# Patient Record
Sex: Male | Born: 1980 | Race: White | Hispanic: No | Marital: Married | Smoking: Never smoker
Health system: Southern US, Community
[De-identification: ages and names within clinical notes are randomized; demographics above are authoritative.]

## PROBLEM LIST (undated history)

## (undated) DIAGNOSIS — J309 Allergic rhinitis, unspecified: Secondary | ICD-10-CM

## (undated) HISTORY — DX: Allergic rhinitis, unspecified: J30.9

---

## 2001-01-31 ENCOUNTER — Emergency Department (HOSPITAL_COMMUNITY): Admission: EM | Admit: 2001-01-31 | Discharge: 2001-01-31 | Payer: Self-pay | Admitting: Emergency Medicine

## 2001-07-14 ENCOUNTER — Encounter: Payer: Self-pay | Admitting: Occupational Medicine

## 2001-07-14 ENCOUNTER — Encounter: Admission: RE | Admit: 2001-07-14 | Discharge: 2001-07-14 | Payer: Self-pay | Admitting: Occupational Medicine

## 2004-11-20 ENCOUNTER — Emergency Department (HOSPITAL_COMMUNITY): Admission: EM | Admit: 2004-11-20 | Discharge: 2004-11-20 | Payer: Self-pay | Admitting: Family Medicine

## 2004-12-10 ENCOUNTER — Encounter: Admission: RE | Admit: 2004-12-10 | Discharge: 2004-12-10 | Payer: Self-pay | Admitting: Gastroenterology

## 2004-12-17 ENCOUNTER — Encounter: Admission: RE | Admit: 2004-12-17 | Discharge: 2004-12-17 | Payer: Self-pay | Admitting: Gastroenterology

## 2005-02-19 ENCOUNTER — Ambulatory Visit (HOSPITAL_COMMUNITY): Admission: RE | Admit: 2005-02-19 | Discharge: 2005-02-19 | Payer: Self-pay | Admitting: *Deleted

## 2005-06-30 ENCOUNTER — Encounter: Admission: RE | Admit: 2005-06-30 | Discharge: 2005-06-30 | Payer: Self-pay | Admitting: Gastroenterology

## 2006-07-23 IMAGING — CT CT PELVIS W/ CM
1 of 3 series · 14 of 32 positions shown, 19 images · IV contrast (READICAT/WATER & 100 ML OMNI 300)
Comparison: none

CLINICAL DATA: Right upper quadrant pain for two weeks with nausea and diarrhea. 
 ABDOMEN CT WITH CONTRAST:
TECHNIQUE: Multidetector CT imaging of the abdomen was performed following the standard protocol during bolus administration of intravenous contrast.
 Contrast:    100 cc Omnipaque 300
TECHNIQUE: Multidetector CT imaging of the pelvis was performed following the standard protocol during bolus administration of intravenous contrast.

[Series 2: routine abdomen · axial · 0.70mm/px · z∈[-389,+1]mm · 14 of 88 slices shown, 19 images]
[im 5/88  soft-tissue]
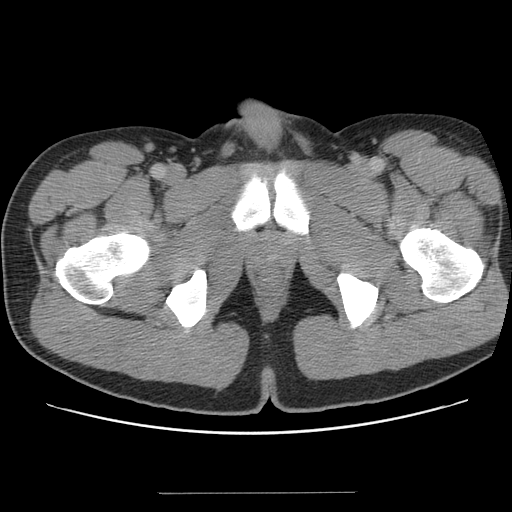
[im 5/88  bone]
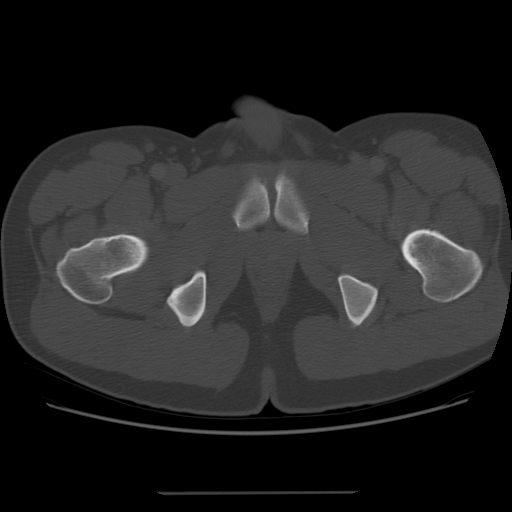
[im 13/88  soft-tissue]
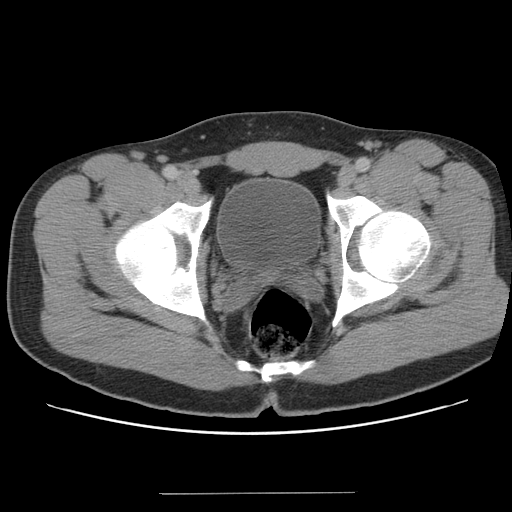
[im 17/88  soft-tissue]
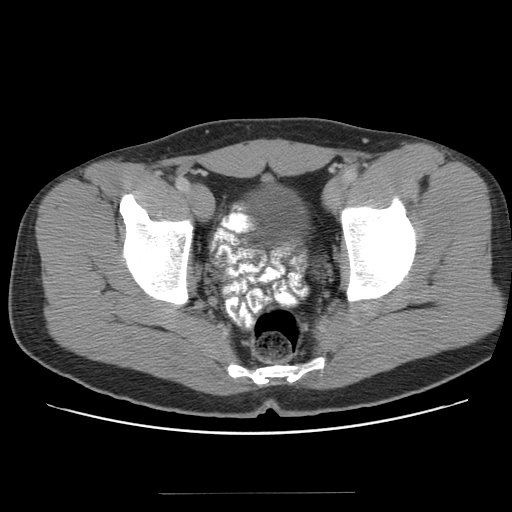
[im 25/88  soft-tissue]
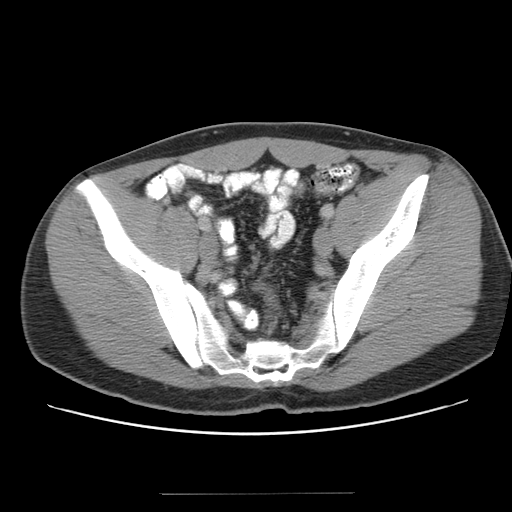
[im 30/88  soft-tissue]
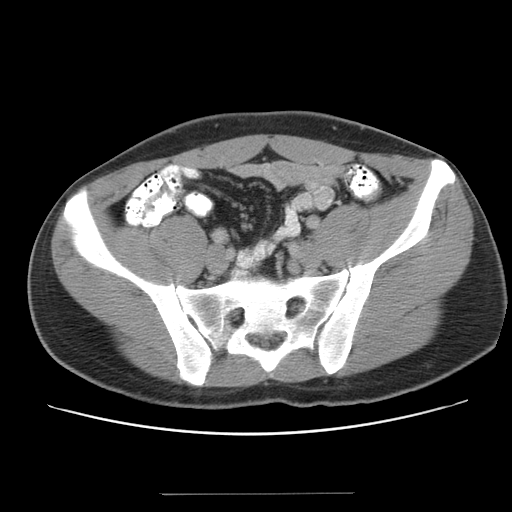
[im 38/88  soft-tissue]
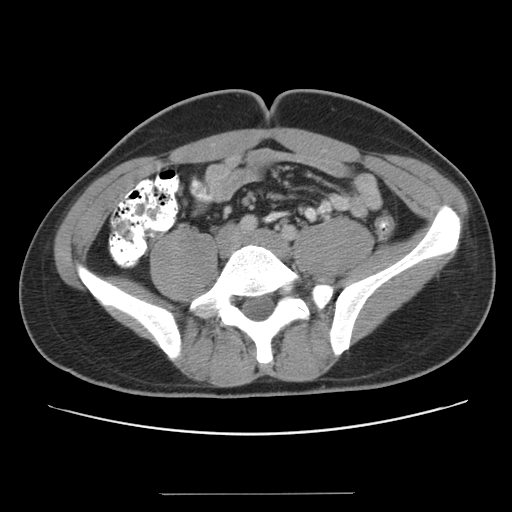
[im 46/88  soft-tissue]
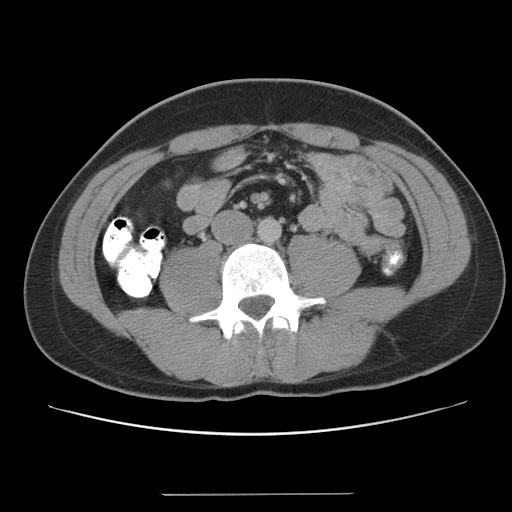
[im 50/88  soft-tissue]
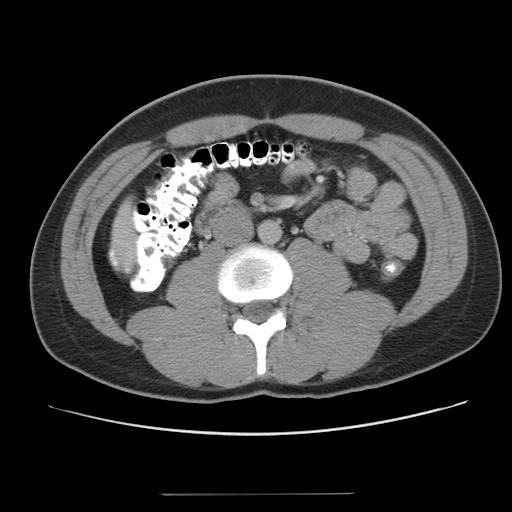
[im 59/88  soft-tissue]
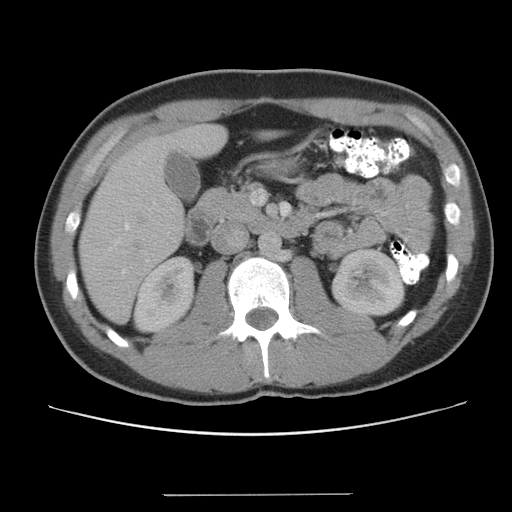
[im 59/88  bone]
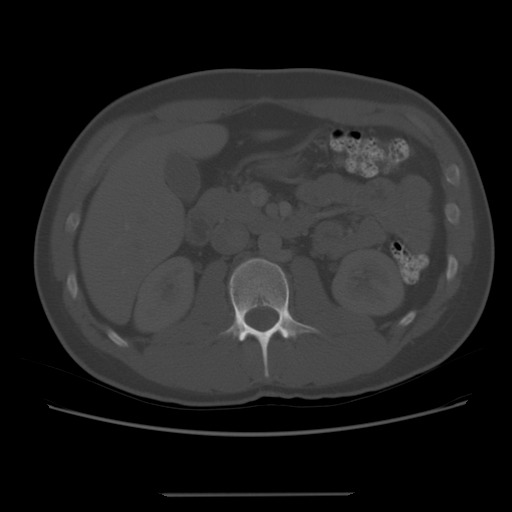
[im 63/88  soft-tissue]
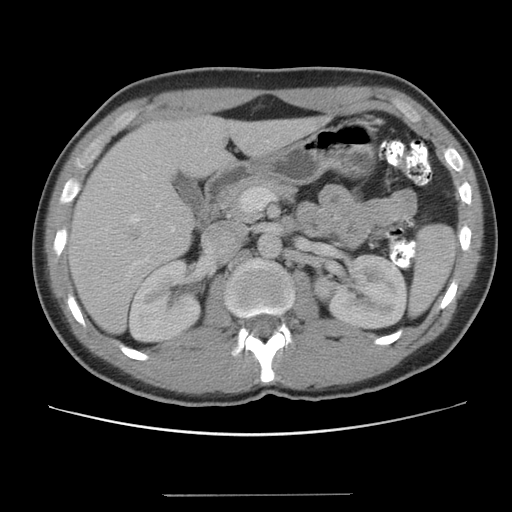
[im 71/88  soft-tissue]
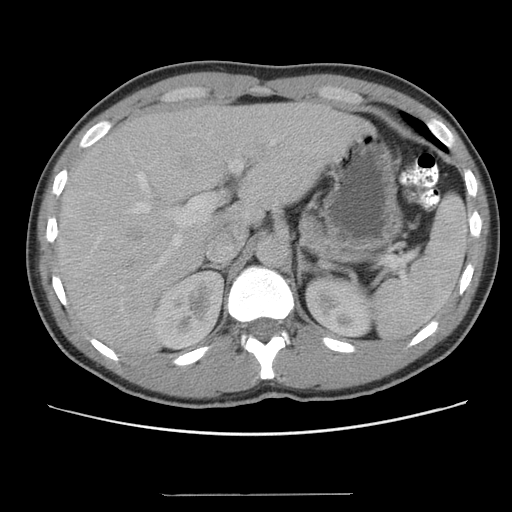
[im 71/88  lung]
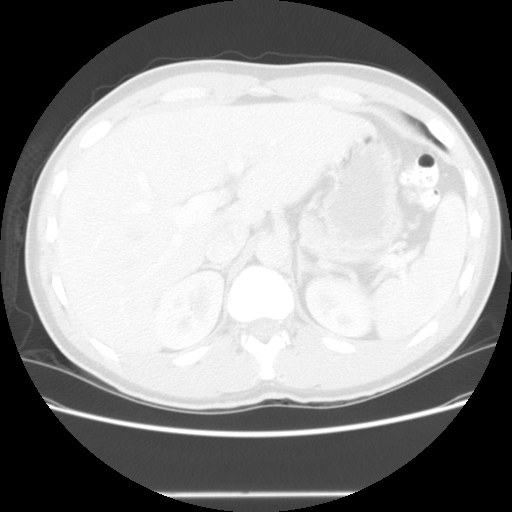
[im 75/88  soft-tissue]
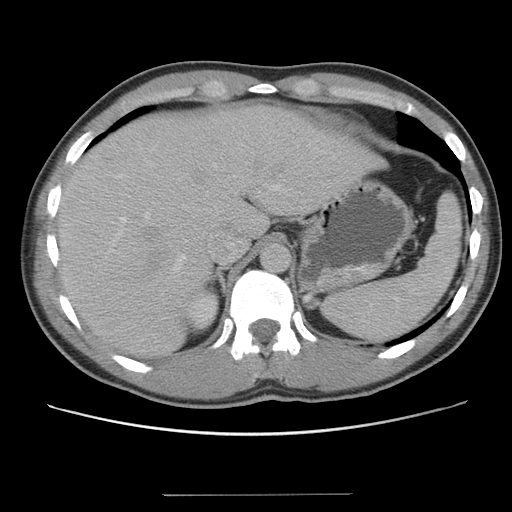
[im 75/88  lung]
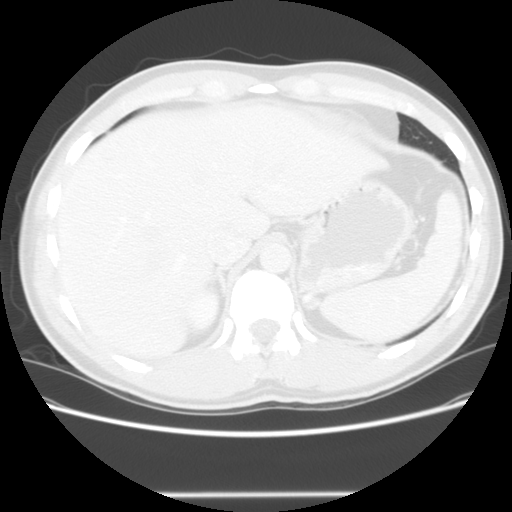
[im 79/88  lung]
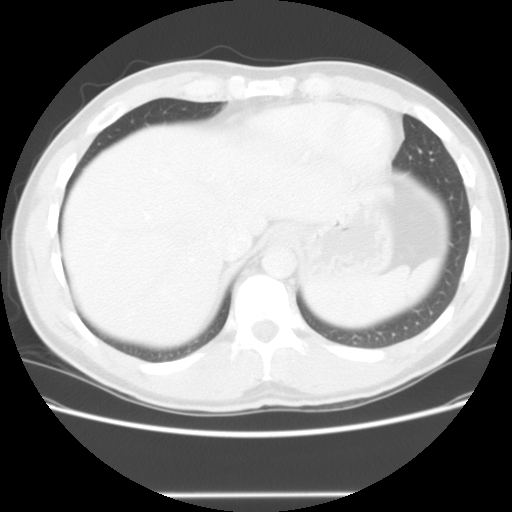
[im 83/88  soft-tissue]
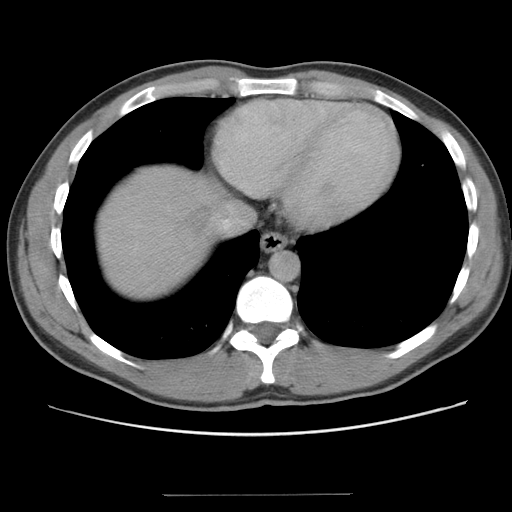
[im 83/88  lung]
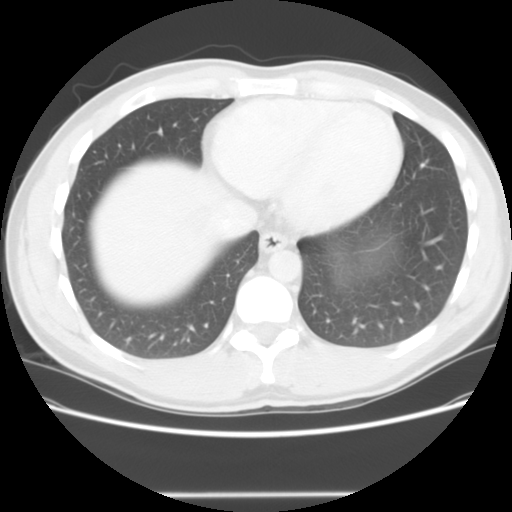

[14 of 32 positions shown; findings below may reference images not displayed]

FINDINGS: The heart is at the upper limits of normal in size for a patient of this age.  Lung bases are clear.  The liver, spleen, pancreas, adrenal glands and kidneys appear normal.  There are no dilated bile ducts, and the gallbladder wall is not thickened.   No dilated loops of large or small bowel.  No free air or free fluid.
IMPRESSION: The CT scan of the abdomen is normal.  Heart is at the upper limits of normal in size.
 PELVIS CT WITH CONTRAST:
FINDINGS: There is no diverticular disease, free fluid, or other significant abnormality.   The distal ureters are not dilated.
 The terminal ileum appears normal.
IMPRESSION: Normal CT scan of the pelvis.

## 2009-12-02 ENCOUNTER — Emergency Department (HOSPITAL_BASED_OUTPATIENT_CLINIC_OR_DEPARTMENT_OTHER): Admission: EM | Admit: 2009-12-02 | Discharge: 2009-12-02 | Payer: Self-pay | Admitting: Emergency Medicine

## 2010-01-20 ENCOUNTER — Emergency Department (HOSPITAL_BASED_OUTPATIENT_CLINIC_OR_DEPARTMENT_OTHER)
Admission: EM | Admit: 2010-01-20 | Discharge: 2010-01-20 | Payer: Self-pay | Source: Home / Self Care | Admitting: Emergency Medicine

## 2010-03-03 ENCOUNTER — Encounter
Admission: RE | Admit: 2010-03-03 | Discharge: 2010-03-03 | Payer: Self-pay | Source: Home / Self Care | Attending: Gastroenterology | Admitting: Gastroenterology

## 2010-07-04 NOTE — Cardiovascular Report (Signed)
[name], [name]                  ACCOUNT NO.:  [id number]   MEDICAL RECORD NO.:  [id number]          PATIENT TYPE:  OIB   LOCATION:  2853                         FACILITY:  [facility]   PHYSICIAN:  Darlin Priestly, MD  DATE OF BIRTH:  30-Jul-1980   DATE OF PROCEDURE:  02/19/2005  DATE OF DISCHARGE:                              CARDIAC CATHETERIZATION   PROCEDURE:  1.  Left heart catheterization.  2.  Coronary angiography.  3.  Left ventriculogram.   ATTENDING PHYSICIAN:  Darlin Priestly, M.D.   COMPLICATIONS:  None.   INDICATIONS FOR PROCEDURE:  [name] is a 30 year old male patient of Dr.  Mila Merry with a history of intermittent tachy palpitations associated  with some mild shortness of breath.  He had a 2D echocardiogram with normal  LV size and function with trace MR and mild TR.  He also had a Cardiolite  scan on January 15, 2005, suggesting mild inferoapical ischemia with an EF  of 45%.  He is now referred for cardiac catheterization to rule out  significant CAD.   DESCRIPTION OF PROCEDURE:  After giving informed written consent, the  patient was brought to the cardiac cath lab were the right and left groins  were shaved, prepped and draped in a sterile fashion.  ECG monitoring was  established.  Using modified Seldinger technique, a 6 French arterial sheath  was inserted in the right femoral artery.  6 French diagnostic catheters  were used to perform diagnostic angiography.   The left main is a large vessel with no significant disease.   The LAD is a large vessel coursing to the apex with two diagonal branches.  The LAD is noted to be tortuous in its mid segment but has no high grade  stenosis.  The first and second diagonals are medium size vessels with no  significant disease.   The left circumflex is a large vessel which is dominant and gives rise to  one obtuse marginal as well as a PDA.  There is no significant disease in  the AV groove circumflex.  The  first OM is a large vessel which bifurcates  distally with no significant disease.  The PDA and posterolateral branch are  large vessels with no significant disease and originating from the  circumflex.   The right coronary artery is a small non-dominant vessel with no significant  disease.   Left ventriculogram reveals a preserved EF of 55-60% with no segmental wall  motion abnormalities.   HEMODYNAMICS:  Systemic arterial pressure 117/78, LV systolic pressure  117/6, LVEDP 13.   CONCLUSION:  1.  No significant CAD.  2.  Normal LV systolic function.      Darlin Priestly, MD  Electronically Signed     RHM/MEDQ  D:  02/19/2005  T:  02/19/2005  Job:  [id number]   cc:   Mila Merry  Fax: [phone]

## 2011-08-19 ENCOUNTER — Emergency Department
Admission: EM | Admit: 2011-08-19 | Discharge: 2011-08-19 | Disposition: A | Discharge status: Home / Self Care | Payer: Self-pay | Source: Home / Self Care

## 2011-08-19 ENCOUNTER — Encounter: Payer: Self-pay | Admitting: *Deleted

## 2011-08-19 DIAGNOSIS — N390 Urinary tract infection, site not specified: Secondary | ICD-10-CM

## 2011-08-19 LAB — POCT URINALYSIS DIP (MANUAL ENTRY)
Ketones, POC UA: NEGATIVE
Leukocytes, UA: NEGATIVE
Nitrite, UA: NEGATIVE
Protein Ur, POC: NEGATIVE
pH, UA: 6.5 (ref 5–8)

## 2011-08-19 MED ORDER — CIPROFLOXACIN HCL 500 MG PO TABS: 500.0000 mg | ORAL_TABLET | Freq: Two times a day (BID) | ORAL | Status: AC

## 2011-08-19 MED ORDER — CIPROFLOXACIN HCL 500 MG PO TABS: 500.0000 mg | ORAL_TABLET | Freq: Two times a day (BID) | ORAL | Status: DC

## 2011-08-19 NOTE — ED Notes (Signed)
Patient c/o dysuria and bladder pain when his bladder feels full x yesterday.

## 2011-08-19 NOTE — ED Provider Notes (Signed)
History     CSN: [id number]  Arrival date & time 08/19/11  1615   None     Chief Complaint  Patient presents with  . Dysuria  HPI Comments: No prior history of STDs  Currently with one sexual partner.  Pt did notice mild R inguinal LAD around onset of dysuria. LAD has since resolved per pt.  Pt is Medical laboratory scientific officer.   Patient is a 31 y.o. male presenting with dysuria.  Dysuria  This is a new problem. The current episode started more than 2 days ago. The problem occurs every urination. The problem has not changed since onset.The quality of the pain is described as burning. The pain is mild. There has been no fever. Associated symptoms include frequency, hesitancy and urgency. Pertinent negatives include no chills, no sweats, no nausea, no vomiting, no discharge, no hematuria and no flank pain. He has tried nothing for the symptoms. His past medical history does not include kidney stones, single kidney, urological procedure, recurrent UTIs, urinary stasis or catheterization.    History reviewed. No pertinent past medical history.  History reviewed. No pertinent past surgical history.  History reviewed. No pertinent family history.  History  Substance Use Topics  . Smoking status: Never Smoker   . Smokeless tobacco: Not on file  . Alcohol Use: Yes      Review of Systems  Constitutional: Negative for chills.  Gastrointestinal: Negative for nausea and vomiting.  Genitourinary: Positive for dysuria, hesitancy, urgency and frequency. Negative for hematuria and flank pain.  All other systems reviewed and are negative.    Allergies  Sulfa drugs cross reactors  Home Medications  No current outpatient prescriptions on file.  BP 137/81  Pulse 58  Temp 98.1 F (36.7 C) (Oral)  Resp 14  Ht 6\' 2"  (1.88 m)  Wt 216 lb (97.977 kg)  BMI 27.73 kg/m2  SpO2 100%  Physical Exam  Constitutional: He is oriented to person, place, and time. He appears well-developed and  well-nourished.  HENT:  Head: Normocephalic and atraumatic.  Eyes: Conjunctivae are normal. Pupils are equal, round, and reactive to light.  Neck: Normal range of motion. Neck supple.  Cardiovascular: Normal rate and regular rhythm.   Pulmonary/Chest: Effort normal and breath sounds normal.  Abdominal: Soft. Bowel sounds are normal. He exhibits no distension. There is no tenderness.       No flank pain   Genitourinary: Rectum normal and penis normal. No penile tenderness.  Musculoskeletal: Normal range of motion.  Neurological: He is alert and oriented to person, place, and time.  Skin: Skin is warm and dry.    ED Course  Procedures (including critical care time)   Labs Reviewed  POCT URINALYSIS DIP (MANUAL ENTRY)  HIV ANTIBODY (ROUTINE TESTING)  RPR  GC/CHLAMYDIA PROBE AMP, URINE  URINE CULTURE   No results found.   No diagnosis found.    MDM  Will treat for complicated UTI with cipro.  Urine cx.  Given inguinal LAD, will also check for STDs. HIV, RPR, GC/Chl.  No penile lesions which is reassuring.  Discussed infectious red flags for reevaluation.  Handout given.  Follow up as needed.     The patient and/or caregiver has been counseled thoroughly with regard to treatment plan and/or medications prescribed including dosage, schedule, interactions, rationale for use, and possible side effects and they verbalize understanding. Diagnoses and expected course of recovery discussed and will return if not improved as expected or if the condition worsens. Patient and/or caregiver  verbalized understanding.             Floydene Flock, MD 08/19/11 [phone]

## 2011-08-20 LAB — GC/CHLAMYDIA PROBE AMP, URINE: GC Probe Amp, Urine: NEGATIVE

## 2011-08-20 NOTE — ED Provider Notes (Signed)
Agree with exam, assessment, and plan.   Lattie Haw, MD 08/20/11 [phone]

## 2011-08-22 ENCOUNTER — Telehealth: Payer: Self-pay

## 2011-08-22 NOTE — ED Notes (Signed)
Left a message on voice mail asking how patient is feeling and advising to call back with any questions or concerns.  

## 2016-02-17 HISTORY — PX: SHOULDER SURGERY: SHX246

## 2016-07-25 ENCOUNTER — Ambulatory Visit: Payer: Self-pay | Admitting: Family Medicine

## 2016-11-05 ENCOUNTER — Other Ambulatory Visit: Payer: Self-pay | Admitting: Orthopedic Surgery

## 2016-11-05 DIAGNOSIS — M7541 Impingement syndrome of right shoulder: Secondary | ICD-10-CM

## 2017-11-10 ENCOUNTER — Ambulatory Visit (INDEPENDENT_AMBULATORY_CARE_PROVIDER_SITE_OTHER): Payer: 59 | Admitting: Psychology

## 2017-11-10 DIAGNOSIS — F411 Generalized anxiety disorder: Secondary | ICD-10-CM

## 2017-11-18 ENCOUNTER — Ambulatory Visit (INDEPENDENT_AMBULATORY_CARE_PROVIDER_SITE_OTHER): Payer: 59 | Admitting: Psychology

## 2017-11-18 DIAGNOSIS — F411 Generalized anxiety disorder: Secondary | ICD-10-CM | POA: Diagnosis not present

## 2017-11-23 ENCOUNTER — Ambulatory Visit (INDEPENDENT_AMBULATORY_CARE_PROVIDER_SITE_OTHER): Payer: 59 | Admitting: Psychology

## 2017-11-23 DIAGNOSIS — F411 Generalized anxiety disorder: Secondary | ICD-10-CM | POA: Diagnosis not present

## 2017-11-30 ENCOUNTER — Ambulatory Visit: Payer: 59 | Admitting: Psychology

## 2017-11-30 DIAGNOSIS — F411 Generalized anxiety disorder: Secondary | ICD-10-CM | POA: Diagnosis not present

## 2017-12-09 ENCOUNTER — Ambulatory Visit (INDEPENDENT_AMBULATORY_CARE_PROVIDER_SITE_OTHER): Payer: 59 | Admitting: Psychology

## 2017-12-09 DIAGNOSIS — F411 Generalized anxiety disorder: Secondary | ICD-10-CM | POA: Diagnosis not present

## 2017-12-24 ENCOUNTER — Ambulatory Visit: Payer: 59 | Admitting: Psychology

## 2017-12-24 ENCOUNTER — Ambulatory Visit (INDEPENDENT_AMBULATORY_CARE_PROVIDER_SITE_OTHER): Payer: 59 | Admitting: Psychology

## 2017-12-24 DIAGNOSIS — F411 Generalized anxiety disorder: Secondary | ICD-10-CM | POA: Diagnosis not present

## 2018-01-07 ENCOUNTER — Ambulatory Visit: Payer: 59 | Admitting: Psychology

## 2018-03-15 ENCOUNTER — Ambulatory Visit: Payer: BLUE CROSS/BLUE SHIELD | Admitting: Psychology

## 2018-11-09 ENCOUNTER — Ambulatory Visit (INDEPENDENT_AMBULATORY_CARE_PROVIDER_SITE_OTHER): Payer: BC Managed Care – PPO | Admitting: Psychology

## 2018-11-09 DIAGNOSIS — F411 Generalized anxiety disorder: Secondary | ICD-10-CM

## 2018-11-23 ENCOUNTER — Encounter: Payer: Self-pay | Admitting: Urology

## 2018-11-23 ENCOUNTER — Ambulatory Visit: Payer: Self-pay | Admitting: Urology

## 2018-12-08 ENCOUNTER — Ambulatory Visit (INDEPENDENT_AMBULATORY_CARE_PROVIDER_SITE_OTHER): Payer: BC Managed Care – PPO | Admitting: Psychology

## 2018-12-08 DIAGNOSIS — F411 Generalized anxiety disorder: Secondary | ICD-10-CM

## 2018-12-30 ENCOUNTER — Ambulatory Visit: Payer: BC Managed Care – PPO | Admitting: Urology

## 2019-01-05 ENCOUNTER — Encounter: Payer: Self-pay | Admitting: Urology

## 2019-01-05 ENCOUNTER — Other Ambulatory Visit: Payer: Self-pay

## 2019-01-05 ENCOUNTER — Ambulatory Visit: Payer: BC Managed Care – PPO | Admitting: Urology

## 2019-01-05 VITALS — BP 129/83 | HR 69 | Ht 73.0 in | Wt 214.0 lb

## 2019-01-05 DIAGNOSIS — Z3009 Encounter for other general counseling and advice on contraception: Secondary | ICD-10-CM

## 2019-01-05 NOTE — Progress Notes (Signed)
01/05/2019 9:22 AM   Cory [name] Aug 01, 1980 [id number]  Referring provider: No referring provider defined for this encounter.  Chief Complaint  Patient presents with   VAS Consult    HPI: 38 y.o. male presents for vasectomy consultation.  He is married with 1 child and states he and his wife desire vasectomy as a means of permanent sterilization.  No previous history of urologic problems including epididymitis or chronic scrotal pain.  No previous history of genitourinary/pelvic surgery.   PMH: History reviewed. No pertinent past medical history.  Surgical History: History reviewed. No pertinent surgical history.  Home Medications:  Allergies as of 01/05/2019      Reactions   Sulfur Hives   Sulfa Drugs Cross Reactors       Medication List    as of January 05, 2019  9:22 AM   You have not been prescribed any medications.     Allergies:  Allergies  Allergen Reactions   Sulfur Hives   Sulfa Drugs Cross Reactors     Family History: History reviewed. No pertinent family history.  Social History:  reports that he has never smoked. He has never used smokeless tobacco. He reports current alcohol use. He reports that he does not use drugs.  ROS: UROLOGY Frequent Urination?: No Hard to postpone urination?: No Burning/pain with urination?: No Get up at night to urinate?: No Leakage of urine?: No Urine stream starts and stops?: No Trouble starting stream?: No Do you have to strain to urinate?: No Blood in urine?: No Urinary tract infection?: No Sexually transmitted disease?: No Injury to kidneys or bladder?: No Painful intercourse?: No Weak stream?: No Erection problems?: No Penile pain?: No  Gastrointestinal Nausea?: No Vomiting?: No Indigestion/heartburn?: No Diarrhea?: No Constipation?: No  Constitutional Fever: No Night sweats?: No Weight loss?: No Fatigue?: No  Skin Skin rash/lesions?: No Itching?: No  Eyes Blurred vision?:  No Double vision?: No  Ears/Nose/Throat Sore throat?: No Sinus problems?: No  Hematologic/Lymphatic Swollen glands?: No Easy bruising?: No  Cardiovascular Leg swelling?: No Chest pain?: No  Respiratory Cough?: No Shortness of breath?: No  Endocrine Excessive thirst?: No  Musculoskeletal Back pain?: No Joint pain?: No  Neurological Headaches?: No Dizziness?: No  Psychologic Depression?: No Anxiety?: No  Physical Exam: BP 129/83    Pulse 69    Ht 6\' 1"  (1.854 m)    Wt 214 lb (97.1 kg)    BMI 28.23 kg/m   Constitutional:  Alert and oriented, No acute distress. HEENT:  AT, moist mucus membranes.  Trachea midline, no masses. Cardiovascular: No clubbing, cyanosis, or edema. Respiratory: Normal respiratory effort, no increased work of breathing. GI: Abdomen is soft, nontender, nondistended, no abdominal masses GU: Phallus circumcised without lesions.  Testes descended bilaterally without masses or tenderness.  Spermatic cord/epididymis palpably normal bilaterally.  Vasa easily palpable. Skin: No rashes, bruises or suspicious lesions. Neurologic: Grossly intact, no focal deficits, moving all 4 extremities. Psychiatric: Normal mood and affect.   Assessment & Plan:    - Undesired fertility We had a long discussion about vasectomy. We specifically discussed the procedure, recovery and the risks, benefits and alternatives of vasectomy. I explained that the procedure entails removal of a segment of each vas deferens, each of which conducts sperm, and that the purpose of this procedure is to cause sterility (inability to produce children or cause pregnancy). Vasectomy is intended to be permanent and irreversible form of contraception. Options for fertility after vasectomy include vasectomy reversal, or sperm retrieval  with in vitro fertilization. These options are not always successful, and they may be expensive. We discussed reversible forms of birth control such as condoms,  IUD or diaphragms, as well as the option of freezing sperm in a sperm bank prior to the vasectomy procedure. We discussed the importance of avoiding strenuous exercise for four days after vasectomy, and the importance of refraining from any form of ejaculation for seven days after vasectomy. I explained that vasectomy does not produce immediate sterility so another form of contraceptive must be used until sterility is assured by having semen checked for sperm. Thus, a post vasectomy semen analysis is necessary to confirm sterility. Rarely, vasectomy must be repeated. We discussed the approximately 1 in 2,000 risk of pregnancy after vasectomy for men who have post-vasectomy semen analysis showing absent sperm or rare non-motile sperm. Typical side effects include a small amount of oozing blood, some discomfort and mild swelling in the area of incision.  Vasectomy does not affect sexual performance, function, please, sensation, interest, desire, satisfaction, penile erection, volume of semen or ejaculation. Other rare risks include allergy or adverse reaction to an anesthetic, testicular atrophy, hematoma, infection/abscess, prolonged tenderness of the vas deferens, pain, swelling, painful nodule or scar (called sperm granuloma) or epididymtis. We discussed chronic testicular pain syndrome. This has been reported to occur in as many as 1-2% of men and may be permanent. This can be treated with medication, small procedures or (rarely) surgery.  Rx Valium sent as a preprocedure anxiolytic.  He was informed he would need a driver if utilizing this medication.   Riki Altes, MD  [facility] [address], [address] [place], [place] [address] [phone]

## 2019-01-06 ENCOUNTER — Ambulatory Visit (INDEPENDENT_AMBULATORY_CARE_PROVIDER_SITE_OTHER): Payer: BC Managed Care – PPO | Admitting: Psychology

## 2019-01-06 ENCOUNTER — Encounter: Payer: Self-pay | Admitting: Urology

## 2019-01-06 DIAGNOSIS — F411 Generalized anxiety disorder: Secondary | ICD-10-CM | POA: Diagnosis not present

## 2019-01-06 MED ORDER — DIAZEPAM 10 MG PO TABS: ORAL_TABLET | ORAL | 0 refills | Status: DC

## 2019-02-16 ENCOUNTER — Ambulatory Visit (INDEPENDENT_AMBULATORY_CARE_PROVIDER_SITE_OTHER): Payer: BC Managed Care – PPO | Admitting: Psychology

## 2019-02-16 DIAGNOSIS — F411 Generalized anxiety disorder: Secondary | ICD-10-CM | POA: Diagnosis not present

## 2019-02-23 ENCOUNTER — Encounter: Payer: BC Managed Care – PPO | Admitting: Urology

## 2019-02-23 ENCOUNTER — Other Ambulatory Visit: Payer: Self-pay

## 2019-02-23 ENCOUNTER — Ambulatory Visit: Payer: BC Managed Care – PPO | Admitting: Urology

## 2019-02-23 VITALS — BP 129/84 | HR 103

## 2019-02-23 DIAGNOSIS — Z302 Encounter for sterilization: Secondary | ICD-10-CM

## 2019-02-23 MED ORDER — HYDROCODONE-ACETAMINOPHEN 5-325 MG PO TABS: 1.0000 | ORAL_TABLET | ORAL | 0 refills | Status: DC | PRN

## 2019-02-23 NOTE — Progress Notes (Signed)
Vasectomy Procedure Note  Indications: The patient is a 39 y.o. male who presents today for elective sterilization.  He has been consented for the procedure.  He is aware of the risks and benefits.  He had no additional questions.  He agrees to proceed.  He denies any other significant change since his last visit.  Pre-operative Diagnosis: Elective sterilization  Post-operative Diagnosis: Elective sterilization  Premedication: Valium 10 mg po  Surgeon: Lorin Picket C. Uday Jantz, M.D  Description: The patient was prepped and draped in the standard fashion.  The right vas deferens was identified and brought superiorly to the anterior scrotal skin.  The skin and vas was then anesthetized utilizing 9 ml 1% lidocaine.  A small stab incision was made and spread with the vas dissector.  The vas was grasped utilizing the vas clamp and elevated out of the incision.  The vas was dissected free from surrounding tissue and vessels and an ~1 cm segment was excised.  The vas lumens were cauterized utilizing electrocautery.  The distal segment was buried in the surrounding sheath with a 3-0 chromic suture.  No significant bleeding was observed.  The vas ends were then dropped back into the hemiscrotum.  The skin was closed with hemostatic pressure.  An identical procedure was performed on the contralateral side.  Clean dry gauze was applied to the incision sites.  The patient tolerated the procedure well.  Complications:None  Recommendations: 1.  No lifting greater than 10 pounds or strenuousactivity for 1 week. 2.  Scrotal support for 1 week. 3.  Shower only for 1 week; may shower in the morning 4.  May resume intercourse in one week if no significant discomfort.  Continue alternate contraception for 12 weeks.  5.  Call for significant pain, swelling, redness, drainage or fever greater than 100.5. 6.  Rx hydrocodone/APAP 5/325 1-2 every 6 hours as needed for pain. 7.  Follow-up semen analysis in 12  weeks.  Irineo Axon, MD

## 2019-02-23 NOTE — Patient Instructions (Signed)
Vasectomy, Care After °This sheet gives you information about how to care for yourself after your procedure. Your health care provider may also give you more specific instructions. If you have problems or questions, contact your health care provider. °What can I expect after the procedure? °After your procedure, it is common to have: °· Mild pain, swelling, redness, or discomfort in your scrotum. °· Some blood coming from your incisions or puncture sites for one or two days. °· Blood in your semen. °Follow these instructions at home: °Medicines ° °· Take over-the-counter and prescription medicines only as told by your health care provider. °· Avoid taking NSAIDs such as aspirin and ibuprofen, because these medicines can make bleeding worse. °Activity °· For the first 2 days after surgery, avoid physical activity and exercise that require a lot of energy. Ask your health care provider what activities are safe for you. °· Do not participate in sports or perform heavy physical labor until your pain has improved, or until your health care provider says it is okay. °· Do not ejaculate for at least 1 week after the procedure, or as long as directed. °· You may resume sexual activity 7-10 days after your procedure, or when your health care provider approves. Use a different method of birth control (contraception) until you have had test results that confirm that there is no sperm in your semen. °Scrotal support °· Use scrotal support, such as a jock strap or underwear with a supportive pouch, as needed for one week after your procedure. °· If you feel discomfort in your scrotum, you may remove the scrotal support to see if the discomfort is relieved. Sometimes scrotal support can press on the scrotum and cause or worsen discomfort. °· If your skin gets irritated, you may add some germ-free (sterile), fluffed bandages or a clean washcloth to the scrotal support. °General instructions °· Put ice on the injured area: °? Put  ice in a plastic bag. °? Place a towel between your skin and the bag. °? Leave the ice on for 20 minutes, 2-3 times a day. °· Check your incisions or puncture sites every day for signs of infection. Check for: °? Redness, swelling, or pain. °? Fluid or blood. °? Warmth. °? Pus or a bad smell. °· Leave stitches (sutures) in place. The sutures will dissolve on their own and do not need to be removed. °· Keep all follow-up visits as told by your health care provider. This is important because you will need a test to confirm that there is no sperm in your semen. Multiple ejaculations are needed to clear out sperm that were beyond the vasectomy site. You will need one test result showing that there is no sperm in your semen before you can resume unprotected sex. This may take 2-4 months after your procedure. °· Do not drive for 24 hours if you were given a sedative to help you relax. °Contact a health care provider if: °· You have redness, swelling, or more pain around your incision or puncture site, or in your scrotum area in general. °· You have bleeding from your incision or puncture site. °· You have pus or a bad smell coming from your incision or puncture site. °· You have a fever. °· Your incision or puncture site opens up. °Get help right away if: °· You develop a rash. °· You have difficulty breathing. °Summary °· After your procedure it is common to have mild pain, swelling, redness, or discomfort in your scrotum. °·   Avoid physical activity and exercise that requires a lot of energy for the first 2 days after surgery. °· Put ice on the injured area. Leave the ice on for 20 minutes, 2-3 times a day. °· Do not drive for 24 hours if you were given a sedative to help you relax. °This information is not intended to replace advice given to you by your health care provider. Make sure you discuss any questions you have with your health care provider. °Document Revised: 01/15/2017 Document Reviewed: 05/01/2016 °Elsevier  Patient Education © 2020 [employer] ° °

## 2019-02-24 ENCOUNTER — Encounter: Payer: Self-pay | Admitting: Urology

## 2019-03-31 ENCOUNTER — Ambulatory Visit: Payer: BC Managed Care – PPO | Admitting: Psychology

## 2019-04-17 DIAGNOSIS — M25561 Pain in right knee: Secondary | ICD-10-CM | POA: Insufficient documentation

## 2019-05-25 ENCOUNTER — Other Ambulatory Visit: Payer: BC Managed Care – PPO

## 2019-05-26 ENCOUNTER — Other Ambulatory Visit: Payer: BC Managed Care – PPO

## 2019-05-26 ENCOUNTER — Encounter (INDEPENDENT_AMBULATORY_CARE_PROVIDER_SITE_OTHER): Payer: Self-pay

## 2019-05-26 ENCOUNTER — Other Ambulatory Visit: Payer: Self-pay

## 2019-05-26 DIAGNOSIS — Z302 Encounter for sterilization: Secondary | ICD-10-CM

## 2019-05-27 LAB — POST-VAS SPERM EVALUATION,QUAL: Volume: 2.5 mL

## 2019-06-05 ENCOUNTER — Telehealth: Payer: Self-pay | Admitting: *Deleted

## 2019-06-05 NOTE — Telephone Encounter (Signed)
Notified patient as instructed, patient pleased. Discussed follow-up appointments, patient agrees  

## 2019-06-05 NOTE — Progress Notes (Signed)
Notified patient as instructed, patient pleased. Discussed follow-up appointments, patient agrees  

## 2019-06-05 NOTE — Telephone Encounter (Signed)
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  Okay to use vasectomy as primary contraception

## 2019-12-01 DIAGNOSIS — M25522 Pain in left elbow: Secondary | ICD-10-CM | POA: Insufficient documentation

## 2019-12-04 ENCOUNTER — Ambulatory Visit (INDEPENDENT_AMBULATORY_CARE_PROVIDER_SITE_OTHER): Payer: BC Managed Care – PPO | Admitting: Psychology

## 2019-12-04 DIAGNOSIS — F411 Generalized anxiety disorder: Secondary | ICD-10-CM

## 2020-01-01 ENCOUNTER — Ambulatory Visit (INDEPENDENT_AMBULATORY_CARE_PROVIDER_SITE_OTHER): Payer: BC Managed Care – PPO | Admitting: Psychology

## 2020-01-01 DIAGNOSIS — F411 Generalized anxiety disorder: Secondary | ICD-10-CM | POA: Diagnosis not present

## 2020-01-22 ENCOUNTER — Ambulatory Visit: Payer: BC Managed Care – PPO | Admitting: Psychology

## 2020-02-12 ENCOUNTER — Ambulatory Visit (INDEPENDENT_AMBULATORY_CARE_PROVIDER_SITE_OTHER): Payer: BC Managed Care – PPO | Admitting: Psychology

## 2020-02-12 DIAGNOSIS — F411 Generalized anxiety disorder: Secondary | ICD-10-CM | POA: Diagnosis not present

## 2020-03-05 ENCOUNTER — Ambulatory Visit: Payer: BC Managed Care – PPO | Admitting: Psychology

## 2020-07-30 ENCOUNTER — Ambulatory Visit: Payer: BC Managed Care – PPO | Admitting: Psychology

## 2020-08-14 DIAGNOSIS — M549 Dorsalgia, unspecified: Secondary | ICD-10-CM | POA: Insufficient documentation

## 2020-08-14 DIAGNOSIS — F419 Anxiety disorder, unspecified: Secondary | ICD-10-CM | POA: Insufficient documentation

## 2020-08-14 DIAGNOSIS — L03311 Cellulitis of abdominal wall: Secondary | ICD-10-CM | POA: Insufficient documentation

## 2021-01-22 DIAGNOSIS — R509 Fever, unspecified: Secondary | ICD-10-CM | POA: Insufficient documentation

## 2021-01-22 DIAGNOSIS — T7840XA Allergy, unspecified, initial encounter: Secondary | ICD-10-CM | POA: Insufficient documentation

## 2021-07-30 ENCOUNTER — Institutional Professional Consult (permissible substitution): Payer: Self-pay | Admitting: Primary Care

## 2021-07-31 ENCOUNTER — Ambulatory Visit (INDEPENDENT_AMBULATORY_CARE_PROVIDER_SITE_OTHER): Payer: BC Managed Care – PPO | Admitting: Primary Care

## 2021-07-31 ENCOUNTER — Encounter: Payer: Self-pay | Admitting: Primary Care

## 2021-07-31 VITALS — BP 128/78 | HR 68 | Temp 98.2°F | Ht 74.0 in | Wt 221.6 lb

## 2021-07-31 DIAGNOSIS — E559 Vitamin D deficiency, unspecified: Secondary | ICD-10-CM | POA: Insufficient documentation

## 2021-07-31 DIAGNOSIS — R0683 Snoring: Secondary | ICD-10-CM

## 2021-07-31 DIAGNOSIS — E291 Testicular hypofunction: Secondary | ICD-10-CM | POA: Insufficient documentation

## 2021-07-31 DIAGNOSIS — J310 Chronic rhinitis: Secondary | ICD-10-CM | POA: Diagnosis not present

## 2021-07-31 DIAGNOSIS — R5383 Other fatigue: Secondary | ICD-10-CM | POA: Insufficient documentation

## 2021-07-31 NOTE — Patient Instructions (Addendum)
Sleep apnea is defined as period of 10 seconds or longer when you stop breathing at night. This can happen multiple times a night. Dx sleep apnea is when this occurs more than 5 times an hour.    Mild OSA 5-15 apneic events an hour Moderate OSA 15-30 apneic events an hour Severe OSA > 30 apneic events an hour   Untreated sleep apnea puts you at higher risk for cardiac arrhythmias, pulmonary HTN, stroke and diabetes   Treatment options include weight loss, side sleeping position, oral appliance, CPAP therapy or referral to ENT for possible surgical options    Recommendations: - Focus on side sleeping position or elevate head of bed 30 degrees while sleeping - Avoid sedating medication or excessive alcohol use prior to bedtime as these can worsen underlying sleep apnea - Do not drive if experiencing excessive daytime sleepiness of fatigue    Orders: Home sleep study re: loud snoring   Referral: ENT re: chronic rhinitis    Follow-up: Please schedule follow-up 1-2 weeks after completing home sleep study to review results and treatment if needed    Sleep Apnea Sleep apnea affects breathing during sleep. It causes breathing to stop for 10 seconds or more, or to become shallow. People with sleep apnea usually snore loudly. It can also increase the risk of: Heart attack. Stroke. Being very overweight (obese). Diabetes. Heart failure. Irregular heartbeat. High blood pressure. The goal of treatment is to help you breathe normally again. What are the causes?  The most common cause of this condition is a collapsed or blocked airway. There are three kinds of sleep apnea: Obstructive sleep apnea. This is caused by a blocked or collapsed airway. Central sleep apnea. This happens when the brain does not send the right signals to the muscles that control breathing. Mixed sleep apnea. This is a combination of obstructive and central sleep apnea. What increases the risk? Being  overweight. Smoking. Having a small airway. Being older. Being male. Drinking alcohol. Taking medicines to calm yourself (sedatives or tranquilizers). Having family members with the condition. Having a tongue or tonsils that are larger than normal. What are the signs or symptoms? Trouble staying asleep. Loud snoring. Headaches in the morning. Waking up gasping. Dry mouth or sore throat in the morning. Being sleepy or tired during the day. If you are sleepy or tired during the day, you may also: Not be able to focus your mind (concentrate). Forget things. Get angry a lot and have mood swings. Feel sad (depressed). Have changes in your personality. Have less interest in sex, if you are male. Be unable to have an erection, if you are male. How is this treated?  Sleeping on your side. Using a medicine to get rid of mucus in your nose (decongestant). Avoiding the use of alcohol, medicines to help you relax, or certain pain medicines (narcotics). Losing weight, if needed. Changing your diet. Quitting smoking. Using a machine to open your airway while you sleep, such as: An oral appliance. This is a mouthpiece that shifts your lower jaw forward. A CPAP device. This device blows air through a mask when you breathe out (exhale). An EPAP device. This has valves that you put in each nostril. A BIPAP device. This device blows air through a mask when you breathe in (inhale) and breathe out. Having surgery if other treatments do not work. Follow these instructions at home: Lifestyle Make changes that your doctor recommends. Eat a healthy diet. Lose weight if needed. Avoid alcohol,  medicines to help you relax, and some pain medicines. Do not smoke or use any products that contain nicotine or tobacco. If you need help quitting, ask your doctor. General instructions Take over-the-counter and prescription medicines only as told by your doctor. If you were given a machine to use while  you sleep, use it only as told by your doctor. If you are having surgery, make sure to tell your doctor you have sleep apnea. You may need to bring your device with you. Keep all follow-up visits. Contact a doctor if: The machine that you were given to use during sleep bothers you or does not seem to be working. You do not get better. You get worse. Get help right away if: Your chest hurts. You have trouble breathing in enough air. You have an uncomfortable feeling in your back, arms, or stomach. You have trouble talking. One side of your body feels weak. A part of your face is hanging down. These symptoms may be an emergency. Get help right away. Call your local emergency services (911 in the [place] Do not wait to see if the symptoms will go away. Do not drive yourself to the hospital. Summary This condition affects breathing during sleep. The most common cause is a collapsed or blocked airway. The goal of treatment is to help you breathe normally while you sleep. This information is not intended to replace advice given to you by your health care provider. Make sure you discuss any questions you have with your health care provider. Document Revised: 09/11/2020 Document Reviewed: 01/12/2020 Elsevier Patient Education  2023 [employer].

## 2021-07-31 NOTE — Progress Notes (Signed)
@Patient  ID: , male    DOB: 1981-01-31, 41 y.o.   MRN: [id number]  Chief Complaint  Patient presents with   Consult    Sleep consult-feels tired a lot, snoring    Referring provider: No ref. provider found  HPI: 41 year old male, never smoked.  Past medical history significant for testicular hypofunction, vitamin D deficiency, fatigue and anxiety disorder.  07/31/2021 Patient presents today for self-referral for sleep consult.  He has symptoms of daytime sleepiness, restless and non-restorative sleep.  He also reports waking up in the morning with a headache and sore throat.  He works as a 08/02/2021.  He has been told by his friends that he snores loudly. Typical bedtime is between 9 and 10 PM.  It takes him less than 15 minutes to fall asleep.  He wakes up on average 6-7 times a night.  He starts his day between 5 and 6 AM.  IT sales professional a side sleeper.  His weight has remained stable of the last 2 years.  No prior sleep studies.  He is not currently on CPAP or oxygen. Epworth score is 15.  No symptoms of narcolepsy, cataplexy or sleepwalking.  Sleep questionnaire Symptoms- snoring, daytime sleepiness, nonrestorative sleep Prior sleep study-none Bedtime-9 to 10 PM Time to fall asleep-less than 15 minutes Nocturnal awakenings-6-7 times Out of bed/start of day-5 to 6 AM Weight changes-relatively stable Do you operate heavy machinery-no Do you currently wear CPAP- no Do you current wear oxygen- no Epworth-15   Allergies  Allergen Reactions   Elemental Sulfur Hives   Sulfa Drugs Cross Reactors      There is no immunization history on file for this patient.  Past Medical History:  Diagnosis Date   Allergic rhinitis     Tobacco History: Social History   Tobacco Use  Smoking Status Never  Smokeless Tobacco Never   Counseling given: Not Answered   Outpatient Medications Prior to Visit  Medication Sig Dispense Refill   diazepam (VALIUM) 10 MG tablet 1 tab po 30  min prior to procedure 1 tablet 0   HYDROcodone-acetaminophen (NORCO/VICODIN) 5-325 MG tablet Take 1 tablet by mouth every 4 (four) hours as needed for moderate pain. 10 tablet 0   No facility-administered medications prior to visit.   Review of Systems  Review of Systems  Constitutional:  Positive for fatigue.  HENT: Negative.    Respiratory: Negative.    Cardiovascular: Negative.   Psychiatric/Behavioral:  Positive for sleep disturbance.    Physical Exam  BP 128/78   Pulse 68   Temp 98.2 F (36.8 C) (Temporal)   Ht 6\' 2"  (1.88 m)   Wt 221 lb 9.6 oz (100.5 kg)   SpO2 98%   BMI 28.45 kg/m  Physical Exam Constitutional:      Appearance: Normal appearance.  HENT:     Mouth/Throat:     Mouth: Mucous membranes are moist.     Pharynx: Oropharynx is clear.  Cardiovascular:     Rate and Rhythm: Normal rate and regular rhythm.  Pulmonary:     Effort: Pulmonary effort is normal.     Breath sounds: Normal breath sounds.  Musculoskeletal:        General: Normal range of motion.     Cervical back: Normal range of motion and neck supple.  Skin:    General: Skin is warm and dry.  Neurological:     General: No focal deficit present.     Mental Status: He is alert and oriented  to person, place, and time. Mental status is at baseline.  Psychiatric:        Mood and Affect: Mood normal.        Behavior: Behavior normal.        Thought Content: Thought content normal.        Judgment: Judgment normal.      Lab Results:  CBC No results found for: "WBC", "RBC", "HGB", "HCT", "PLT", "MCV", "MCH", "MCHC", "RDW", "LYMPHSABS", "MONOABS", "EOSABS", "BASOSABS"  BMET No results found for: "NA", "K", "CL", "CO2", "GLUCOSE", "BUN", "CREATININE", "CALCIUM", "GFRNONAA", "GFRAA"  BNP No results found for: "BNP"  ProBNP No results found for: "PROBNP"  Imaging: No results found.   Assessment & Plan:   Snoring -Patient has symptoms of snoring, restless and non-restorative sleep.   Epworth 17. BMI 28. Concern patient could have obstructive sleep apnea, needs home sleep study to evaluate. Discussed risk of untreated sleep apnea including cardiac arrhythmias, pulm HTN, stroke, DM. We briefly reviewed treatment options. Encouraged patient to work on weight loss efforts and focus on side sleeping position/elevate head of bed. Advised against driving if experiencing excessive daytime sleepiness. Follow-up in 4-6 weeks to review sleep study results and discuss treatment options further  Chronic rhinitis - Patient has symptoms of chronic nasal congestion and would like referral to ENT. Would recommend getting sleep study first before scheduling an apt with them.    Glenford Bayley, NP 08/01/2021

## 2021-08-01 DIAGNOSIS — R0683 Snoring: Secondary | ICD-10-CM | POA: Insufficient documentation

## 2021-08-01 DIAGNOSIS — J310 Chronic rhinitis: Secondary | ICD-10-CM | POA: Insufficient documentation

## 2021-08-01 NOTE — Assessment & Plan Note (Addendum)
-   Patient has symptoms of chronic nasal congestion and would like referral to ENT. Would recommend getting sleep study first before scheduling an apt with them.

## 2021-08-01 NOTE — Assessment & Plan Note (Signed)
-  Patient has symptoms of snoring, restless and non-restorative sleep.  Epworth 17. BMI 28. Concern patient could have obstructive sleep apnea, needs home sleep study to evaluate. Discussed risk of untreated sleep apnea including cardiac arrhythmias, pulm HTN, stroke, DM. We briefly reviewed treatment options. Encouraged patient to work on weight loss efforts and focus on side sleeping position/elevate head of bed. Advised against driving if experiencing excessive daytime sleepiness. Follow-up in 4-6 weeks to review sleep study results and discuss treatment options further

## 2021-08-04 NOTE — Progress Notes (Signed)
Reviewed and agree with assessment/plan.   Qiana Landgrebe, MD [facility] 08/04/2021, 8:57 AM Pager:  [phone]  

## 2021-10-06 ENCOUNTER — Ambulatory Visit: Payer: BC Managed Care – PPO

## 2021-10-06 DIAGNOSIS — R0683 Snoring: Secondary | ICD-10-CM

## 2021-10-17 ENCOUNTER — Ambulatory Visit (INDEPENDENT_AMBULATORY_CARE_PROVIDER_SITE_OTHER): Payer: BC Managed Care – PPO

## 2021-10-25 DIAGNOSIS — J209 Acute bronchitis, unspecified: Secondary | ICD-10-CM | POA: Insufficient documentation

## 2021-10-29 ENCOUNTER — Telehealth: Payer: Self-pay | Admitting: Primary Care

## 2021-10-29 DIAGNOSIS — R0683 Snoring: Secondary | ICD-10-CM

## 2021-10-29 NOTE — Telephone Encounter (Signed)
-----   Message from Courtney Paris sent at 10/22/2021  4:33 PM EDT ----- Hello Ms Clent Ridges  This patient attemped hst 3/times unable to get a good reading from hst each time. pt requested in-lab study.   Please advise  Collette

## 2021-10-29 NOTE — Telephone Encounter (Signed)
I have put an order in for PSG.

## 2021-11-13 ENCOUNTER — Ambulatory Visit: Payer: BC Managed Care – PPO | Admitting: Cardiovascular Disease

## 2021-11-28 ENCOUNTER — Ambulatory Visit: Payer: BC Managed Care – PPO

## 2021-11-28 DIAGNOSIS — G4733 Obstructive sleep apnea (adult) (pediatric): Secondary | ICD-10-CM | POA: Diagnosis not present

## 2021-12-08 DIAGNOSIS — G4733 Obstructive sleep apnea (adult) (pediatric): Secondary | ICD-10-CM | POA: Diagnosis not present

## 2021-12-12 NOTE — Progress Notes (Signed)
Home sleep study 11/29/2021 showed very mild obstructive sleep apnea moderate oxygen desaturation, he had on average 5.7 events an hour.  He is virtual visit to discuss results and treatment options

## 2021-12-19 ENCOUNTER — Ambulatory Visit: Payer: 59 | Admitting: Primary Care

## 2021-12-19 ENCOUNTER — Telehealth (INDEPENDENT_AMBULATORY_CARE_PROVIDER_SITE_OTHER): Payer: 59 | Admitting: Primary Care

## 2021-12-19 DIAGNOSIS — G4730 Sleep apnea, unspecified: Secondary | ICD-10-CM | POA: Diagnosis not present

## 2021-12-19 NOTE — Progress Notes (Signed)
Virtual Visit via Video Note  I connected with Cory [name] on 12/19/21 at 10:00 AM EDT by a video enabled telemedicine application and verified that I am speaking with the correct person using two identifiers.  Location: Patient: Home Provider: Office    I discussed the limitations of evaluation and management by telemedicine and the availability of in person appointments. The patient expressed understanding and agreed to proceed.  History of Present Illness: 41 year old male, never smoked.  Past medical history significant for testicular hypofunction, vitamin D deficiency, fatigue and anxiety disorder.  Previous [facility] pulmonary encounter: 07/31/2021 Patient presents today for self-referral for sleep consult.  He has symptoms of daytime sleepiness, restless and non-restorative sleep.  He also reports waking up in the morning with a headache and sore throat.  He works as a Airline pilot.  He has been told by his friends that he snores loudly. Typical bedtime is between 9 and 10 PM.  It takes him less than 15 minutes to fall asleep.  He wakes up on average 6-7 times a night.  He starts his day between 5 and 6 AM.  Cory [name] a side sleeper.  His weight has remained stable of the last 2 years.  No prior sleep studies.  He is not currently on CPAP or oxygen. Epworth score is 15.  No symptoms of narcolepsy, cataplexy or sleepwalking.  Sleep questionnaire Symptoms- snoring, daytime sleepiness, nonrestorative sleep Prior sleep study-none Bedtime-9 to 10 PM Time to fall asleep-less than 15 minutes Nocturnal awakenings-6-7 times Out of bed/start of day-5 to 6 AM Weight changes-relatively stable Do you operate heavy machinery-no Do you currently wear CPAP- no Do you current wear oxygen- no Epworth-15   12/19/2021- Interim hx  Patient contacted today to review sleep study results.  Patient has symptoms of snoring, restless sleep, daytime sleepiness and morning headache. Epworth score 15.  Patient had home  sleep study on 11/29/2021 that showed mild obstructive sleep, AHI 5.7/hour, O2 79% (95%).  We discussed treatment options including weight loss, oral appliance, CPAP therapy or referral to ENT for possible surgical options.  Patient would like to hold off on starting CPAP at this time.  Reviewed good candidate for oral appliance.  He does not sleep on his back.    Observations/Objective:  - Appears well without overt shortness of breath, wheezing  Assessment and Plan:  Mild OSA: -Patient's symptoms of snoring and restless sleep. Associated morning headaches.  Epworth score 15.  Home sleep study on 11/29/2021 showed mild obstructive sleep apnea, AHI 5.7 an hour. Reviewed treatment options with patient, he would like to try oral appliance for treatment of mild OSA. Encourage weight loss and side sleeping position.   Follow Up Instructions:   - 6 months with Eustaquio Maize NP  I discussed the assessment and treatment plan with the patient. The patient was provided an opportunity to ask questions and all were answered. The patient agreed with the plan and demonstrated an understanding of the instructions.   The patient was advised to call back or seek an in-person evaluation if the symptoms worsen or if the condition fails to improve as anticipated.  I provided 22 minutes of non-face-to-face time during this encounter.   Martyn Ehrich, NP

## 2022-07-14 DIAGNOSIS — M79644 Pain in right finger(s): Secondary | ICD-10-CM | POA: Insufficient documentation

## 2022-08-11 ENCOUNTER — Telehealth: Payer: Self-pay | Admitting: Primary Care

## 2022-08-11 NOTE — Telephone Encounter (Signed)
Noted. Nothing further needed. 

## 2022-08-11 NOTE — Telephone Encounter (Signed)
Working recall pt. No long want to f/u he is having no issues

## 2022-11-22 ENCOUNTER — Telehealth: Payer: No Typology Code available for payment source | Admitting: Physician Assistant

## 2022-11-22 DIAGNOSIS — H109 Unspecified conjunctivitis: Secondary | ICD-10-CM | POA: Diagnosis not present

## 2022-11-22 MED ORDER — MOXIFLOXACIN HCL 0.5 % OP SOLN: 1.0000 [drp] | Freq: Three times a day (TID) | OPHTHALMIC | 0 refills | Status: DC

## 2022-11-22 NOTE — Patient Instructions (Signed)
[facility], thank you for joining Margaretann Loveless, PA-C for today's virtual visit.  While this provider is not your primary care provider (PCP), if your PCP is located in our provider database this encounter information will be shared with them immediately following your visit.   A [facility] MyChart account gives you access to today's visit and all your visits, tests, and labs performed at [facility] " click here if you don't have a [facility] MyChart account or go to [email or web address]  Consent: (Patient) Cory [name] provided verbal consent for this virtual visit at the beginning of the encounter.  Current Medications:  Current Outpatient Medications:    moxifloxacin (VIGAMOX) 0.5 % ophthalmic solution, Place 1 drop into the left eye 3 (three) times daily. For 5 days, Disp: 3 mL, Rfl: 0   azithromycin (ZITHROMAX) 250 MG tablet, Take by mouth. (Patient not taking: Reported on 12/19/2021), Disp: , Rfl:    chlorpheniramine (CHLOR-TRIMETON) 4 MG tablet, Take 4 mg by mouth every 4 (four) hours as needed for allergies or rhinitis., Disp: , Rfl:    testosterone cypionate (DEPOTESTOSTERONE CYPIONATE) 200 MG/ML injection, Inject 200 mg into the muscle every 14 (fourteen) days., Disp: , Rfl:    Medications ordered in this encounter:  Meds ordered this encounter  Medications   moxifloxacin (VIGAMOX) 0.5 % ophthalmic solution    Sig: Place 1 drop into the left eye 3 (three) times daily. For 5 days    Dispense:  3 mL    Refill:  0    Order Specific Question:   Supervising Provider    Answer:   Merrilee Jansky [[id number]]     *If you need refills on other medications prior to your next appointment, please contact your pharmacy*  Follow-Up: Call back or seek an in-person evaluation if the symptoms worsen or if the condition fails to improve as anticipated.  [facility] [phone]  Other Instructions  Bacterial Conjunctivitis, Adult Bacterial  conjunctivitis is an infection of the clear membrane that covers the white part of the eye and the inner surface of the eyelid (conjunctiva). When the blood vessels in the conjunctiva become inflamed, the eye becomes red or pink. The eye often feels irritated or itchy. Bacterial conjunctivitis spreads easily from person to person (is contagious). It also spreads easily from one eye to the other eye. What are the causes? This condition is caused by bacteria. You may get the infection if you come into close contact with: A person who is infected with the bacteria. Items that are contaminated with the bacteria, such as a face towel, contact lens solution, or eye makeup. What increases the risk? You are more likely to develop this condition if: You are exposed to other people who have the infection. You wear contact lenses. You have a sinus infection. You have had a recent eye injury or surgery. You have a weak body defense system (immune system). You have a medical condition that causes dry eyes. What are the signs or symptoms? Symptoms of this condition include: Thick, yellowish discharge from the eye. This may turn into a crust on the eyelid overnight and cause your eyelids to stick together. Tearing or watery eyes. Itchy eyes. Burning feeling in your eyes. Eye redness. Swollen eyelids. Blurred vision. How is this diagnosed? This condition is diagnosed based on your symptoms and medical history. Your health care provider may also take a sample of discharge from your eye to find the cause of  your infection. How is this treated? This condition may be treated with: Antibiotic eye drops or ointment to clear the infection more quickly and prevent the spread of infection to others. Antibiotic medicines taken by mouth (orally) to treat infections that do not respond to drops or ointments or that last longer than 10 days. Cool, wet cloths (cool compresses) placed on the eyes. Artificial tears  applied 2-6 times a day. Follow these instructions at home: Medicines Take or apply your antibiotic medicine as told by your health care provider. Do not stop using the antibiotic, even if your condition improves, unless directed by your health care provider. Take or apply over-the-counter and prescription medicines only as told by your health care provider. Be very careful to avoid touching the edge of your eyelid with the eye-drop bottle or the ointment tube when you apply medicines to the affected eye. This will keep you from spreading the infection to your other eye or to other people. Managing discomfort Gently wipe away any drainage from your eye with a warm, wet washcloth or a cotton ball. Apply a clean, cool compress to your eye for 10-20 minutes, 3-4 times a day. General instructions Do not wear contact lenses until the inflammation is gone and your health care provider says it is safe to wear them again. Ask your health care provider how to sterilize or replace your contact lenses before you use them again. Wear glasses until you can resume wearing contact lenses. Avoid wearing eye makeup until the inflammation is gone. Throw away any old eye cosmetics that may be contaminated. Change or wash your pillowcase every day. Do not share towels or washcloths. This may spread the infection. Wash your hands often with soap and water for at least 20 seconds and especially before touching your face or eyes. Use paper towels to dry your hands. Avoid touching or rubbing your eyes. Do not drive or use heavy machinery if your vision is blurred. Contact a health care provider if: You have a fever. Your symptoms do not get better after 10 days. Get help right away if: You have a fever and your symptoms suddenly get worse. You have severe pain when you move your eye. You have facial pain, redness, or swelling. You have a sudden loss of vision. Summary Bacterial conjunctivitis is an infection of  the clear membrane that covers the white part of the eye and the inner surface of the eyelid (conjunctiva). Bacterial conjunctivitis spreads easily from eye to eye and from person to person (is contagious). Wash your hands often with soap and water for at least 20 seconds and especially before touching your face or eyes. Use paper towels to dry your hands. Take or apply your antibiotic medicine as told by your health care provider. Do not stop using the antibiotic even if your condition improves. Contact a health care provider if you have a fever or if your symptoms do not get better after 10 days. Get help right away if you have a sudden loss of vision. This information is not intended to replace advice given to you by your health care provider. Make sure you discuss any questions you have with your health care provider. Document Revised: 05/15/2020 Document Reviewed: 05/15/2020 Elsevier Patient Education  2024 [employer]    If you have been instructed to have an in-person evaluation today at a local Urgent Care facility, please use the link below. It will take you to a list of all of our available [facility], including address, phone number and hours of operation. Please do not delay care.  [facility]  If you or a family member do not have a primary care provider, use the link below to schedule a visit and establish care. When you choose a [facility] primary care physician or advanced practice provider, you gain a long-term partner in health. Find a Primary Care Provider  Learn more about [facility] in-office and virtual care options: [facility] - Get Care Now

## 2022-11-22 NOTE — Progress Notes (Signed)
Virtual Visit Consent   Cory [name], you are scheduled for a virtual visit with a [facility] provider today. Just as with appointments in the office, your consent must be obtained to participate. Your consent will be active for this visit and any virtual visit you may have with one of our providers in the next 365 days. If you have a MyChart account, a copy of this consent can be sent to you electronically.  As this is a virtual visit, video technology does not allow for your provider to perform a traditional examination. This may limit your provider's ability to fully assess your condition. If your provider identifies any concerns that need to be evaluated in person or the need to arrange testing (such as labs, EKG, etc.), we will make arrangements to do so. Although advances in technology are sophisticated, we cannot ensure that it will always work on either your end or our end. If the connection with a video visit is poor, the visit may have to be switched to a telephone visit. With either a video or telephone visit, we are not always able to ensure that we have a secure connection.  By engaging in this virtual visit, you consent to the provision of healthcare and authorize for your insurance to be billed (if applicable) for the services provided during this visit. Depending on your insurance coverage, you may receive a charge related to this service.  I need to obtain your verbal consent now. Are you willing to proceed with your visit today? Cory [name] has provided verbal consent on 11/22/2022 for a virtual visit (video or telephone). Margaretann Loveless, PA-C  Date: 11/22/2022 3:04 PM  Virtual Visit via Video Note   I, Margaretann Loveless, connected with  Cory [name]  ([id number], 03-29-1980) on 11/22/22 at  3:00 PM EDT by a video-enabled telemedicine application and verified that I am speaking with the correct person using two identifiers.  Location: Patient: Virtual Visit Location Patient:  Home Provider: Virtual Visit Location Provider: Home Office   I discussed the limitations of evaluation and management by telemedicine and the availability of in person appointments. The patient expressed understanding and agreed to proceed.    History of Present Illness: Cory [name] is a 42 y.o. who identifies as a male who was assigned male at birth, and is being seen today for possible eye infection.  HPI: Eye Problem  The left eye is affected. This is a new problem. The current episode started in the past 7 days (Thursday,11/19/22). The problem occurs constantly. The problem has been gradually worsening. The injury mechanism was a foreign body (had a bug fly into left eye). The pain is moderate. There is No known exposure to pink eye. He Does not wear contacts. Associated symptoms include blurred vision, an eye discharge, eye redness and photophobia. Pertinent negatives include no double vision, fever, foreign body sensation, itching, nausea, recent URI or vomiting. He has tried eye drops (cold compresses) for the symptoms. The treatment provided no relief.     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 For 5 days, Disp: 3 mL, Rfl: 0   azithromycin (ZITHROMAX) 250 MG tablet, Take by mouth. (Patient not taking: Reported on 12/19/2021), Disp: , Rfl:    chlorpheniramine (CHLOR-TRIMETON) 4 MG tablet, Take 4 mg by mouth every 4 (four) hours as needed for allergies or rhinitis., Disp: , Rfl:    testosterone cypionate (DEPOTESTOSTERONE  CYPIONATE) 200 MG/ML injection, Inject 200 mg into the muscle every 14 (fourteen) days., Disp: , Rfl:   Observations/Objective: Patient is well-developed, well-nourished in no acute distress.  Resting comfortably at home.  Head is normocephalic, atraumatic.  No labored breathing.  Speech is clear and coherent with logical content.  Patient is alert and oriented at baseline.    Assessment and Plan: 1. Bacterial conjunctivitis of left eye - moxifloxacin (VIGAMOX) 0.5 % ophthalmic solution; Place 1 drop into the left eye 3 (three) times daily. For 5 days  Dispense: 3 mL; Refill: 0  - Suspect bacterial conjunctivitis - Vigamox prescribed - Warm compresses - Good hand hygiene - Seek in person evaluation if symptoms worsen or fail to improve   Follow Up Instructions: I discussed the assessment and treatment plan with the patient. The patient was provided an opportunity to ask questions and all were answered. The patient agreed with the plan and demonstrated an understanding of the instructions.  A copy of instructions were sent to the patient via MyChart unless otherwise noted below.    The patient was advised to call back or seek an in-person evaluation if the symptoms worsen or if the condition fails to improve as anticipated.   Margaretann Loveless, PA-C

## 2022-11-30 ENCOUNTER — Other Ambulatory Visit: Payer: Self-pay

## 2022-12-01 NOTE — Progress Notes (Unsigned)
Celso Amy, PA-C [address]  [address]  [place], [place] [address]  Main: [phone]  Fax: [phone]   Gastroenterology Consultation  Referring Provider:     No ref. provider found Primary Care Physician:  Cory Bamberger, NP (Inactive) Primary Gastroenterologist:  Celso Amy, PA-C / Dr. Wyline Mood   Reason for Consultation:     Rectal pain and itching        HPI:   Cory [name] is a 42 y.o. y/o male referred for consultation & management  by Cory Bamberger, NP (Inactive).    Patient is here to evaluate rectal itching, burning, and possible hemorrhoid for 3 to 4 months.  He has noticed a swollen small lump at the rectum which comes and goes.  Tried OTC hemorrhoid cream with little benefit.  Bowel movements are regular soft daily.  He denies diarrhea, constipation, hard stools or straining.  He has occasional mild rectal bleeding which he attributes to the hemorrhoid.  Denies abdominal pain or unintentional weight loss.  No family history of colon cancer.  He reports having colonoscopy and EGD 10 to 12 years ago at [facility] in [place].  I cannot find any results in epic.  Past Medical History:  Diagnosis Date   Allergic rhinitis     Past Surgical History:  Procedure Laterality Date   SHOULDER SURGERY Right 2018    Prior to Admission medications   Medication Sig Start Date End Date Taking? Authorizing Provider  moxifloxacin (VIGAMOX) 0.5 % ophthalmic solution Place 1 drop into the left eye 3 (three) times daily. For 5 days 11/22/22   Margaretann Loveless, PA-C  predniSONE (DELTASONE) 10 MG tablet TAKE AS DIRECTED, 12-DAY TAPER.    [provider]  testosterone cypionate (DEPOTESTOSTERONE CYPIONATE) 200 MG/ML injection Inject 200 mg into the muscle every 14 (fourteen) days.    [provider]    History reviewed. No pertinent family history.   Social History   Tobacco Use   Smoking status: Never   Smokeless tobacco: Never  Substance  Use Topics   Alcohol use: Yes   Drug use: No    Allergies as of 12/02/2022 - Review Complete 12/02/2022  Allergen Reaction Noted   Elemental sulfur Hives 11/22/2018   Sulfa drugs cross reactors Hives 11/30/2022    Review of Systems:    All systems reviewed and negative except where noted in HPI.   Physical Exam:  BP 116/71   Pulse 93   Temp 98.3 F (36.8 C)   Ht 6\' 2"  (1.88 m)   Wt 218 lb 9.6 oz (99.2 kg)   BMI 28.07 kg/m  No LMP for male patient.  General:   Alert,  Well-developed, well-nourished, pleasant and cooperative in NAD Lungs:  Respirations even and unlabored.  Clear throughout to auscultation.   No wheezes, crackles, or rhonchi. No acute distress. Heart:  Regular rate and rhythm; no murmurs, clicks, rubs, or gallops. Abdomen:  Normal bowel sounds.  No bruits.  Soft, and non-distended without masses, hepatosplenomegaly or hernias noted.  No Tenderness.  No guarding or rebound tenderness.    Rectal: Moderate hair present around the rectum.  There is 1 small external non-tender, non-thrombosed hemorrhoid to the left side.  No tenderness.  No anal fissure or abscess.  No internal rectal masses.  No obvious rashes.  Imaging Studies: No results found.  Assessment and Plan:   Cory [name] is a 42 y.o. y/o male has been referred for:  1.  1 small nonthrombosed external hemorrhoid  Discussed treatment for hemorrhoids at length. For External Hemorrhoid: Rx hydrocortisone 2.5% cream apply twice daily as needed. Warm water sitz bath with epsom salt for flare up of external hemorrhoids. Use OTC Preparation H, Tucks Pads, and Witch Hazel wipes as needed.  2.  Rectal bleeding -mild and intermittent, most likely due to external hemorrhoid.  Scheduling Colonoscopy I discussed risks of colonoscopy with patient to include risk of bleeding, colon perforation, and risk of sedation.  Patient expressed understanding and agrees to proceed with colonoscopy.   Follow up as needed  based on colonoscopy and GI symptoms.  Celso Amy, PA-C

## 2022-12-02 ENCOUNTER — Encounter: Payer: Self-pay | Admitting: Physician Assistant

## 2022-12-02 ENCOUNTER — Other Ambulatory Visit: Payer: Self-pay

## 2022-12-02 ENCOUNTER — Ambulatory Visit (INDEPENDENT_AMBULATORY_CARE_PROVIDER_SITE_OTHER): Payer: No Typology Code available for payment source | Admitting: Physician Assistant

## 2022-12-02 VITALS — BP 116/71 | HR 93 | Temp 98.3°F | Ht 74.0 in | Wt 218.6 lb

## 2022-12-02 DIAGNOSIS — K625 Hemorrhage of anus and rectum: Secondary | ICD-10-CM | POA: Diagnosis not present

## 2022-12-02 DIAGNOSIS — K644 Residual hemorrhoidal skin tags: Secondary | ICD-10-CM

## 2022-12-02 DIAGNOSIS — K649 Unspecified hemorrhoids: Secondary | ICD-10-CM

## 2022-12-02 MED ORDER — HYDROCORTISONE (PERIANAL) 2.5 % EX CREA: 1.0000 | TOPICAL_CREAM | Freq: Two times a day (BID) | CUTANEOUS | 1 refills | Status: AC

## 2022-12-02 MED ORDER — NA SULFATE-K SULFATE-MG SULF 17.5-3.13-1.6 GM/177ML PO SOLN: 1.0000 | Freq: Once | ORAL | 0 refills | Status: AC

## 2022-12-02 NOTE — Patient Instructions (Signed)
For External Hemorrhoid: Rx hydrocortisone 2.5% cream apply twice daily as needed. Warm water sitz bath with epsom salt for flare up of external hemorrhoids. Use OTC Preparation H, Tucks Pads, and Witch Hazel wipes as needed.

## 2022-12-31 ENCOUNTER — Encounter: Payer: Self-pay | Admitting: Gastroenterology

## 2023-01-06 ENCOUNTER — Ambulatory Visit: Payer: No Typology Code available for payment source | Admitting: Physician Assistant

## 2023-01-07 ENCOUNTER — Encounter
Admission: RE | Disposition: A | Discharge status: Home / Self Care | Payer: Self-pay | Source: Home / Self Care | Attending: Gastroenterology

## 2023-01-07 ENCOUNTER — Ambulatory Visit: Payer: 59 | Admitting: Anesthesiology

## 2023-01-07 ENCOUNTER — Encounter: Payer: Self-pay | Admitting: Gastroenterology

## 2023-01-07 ENCOUNTER — Ambulatory Visit
Admission: RE | Admit: 2023-01-07 | Discharge: 2023-01-07 | Disposition: A | Discharge status: Home / Self Care | Payer: 59 | Attending: Gastroenterology | Admitting: Gastroenterology

## 2023-01-07 DIAGNOSIS — K625 Hemorrhage of anus and rectum: Secondary | ICD-10-CM

## 2023-01-07 DIAGNOSIS — K640 First degree hemorrhoids: Secondary | ICD-10-CM | POA: Diagnosis not present

## 2023-01-07 HISTORY — PX: COLONOSCOPY WITH PROPOFOL: SHX5780

## 2023-01-07 SURGERY — COLONOSCOPY WITH PROPOFOL
Anesthesia: General

## 2023-01-07 MED ORDER — SODIUM CHLORIDE 0.9 % IV SOLN: INTRAVENOUS | Status: DC

## 2023-01-07 MED ORDER — DEXMEDETOMIDINE HCL IN NACL 80 MCG/20ML IV SOLN
INTRAVENOUS | Status: DC | PRN
  Administered 2023-01-07: 8 ug via INTRAVENOUS

## 2023-01-07 MED ORDER — PROPOFOL 10 MG/ML IV BOLUS
INTRAVENOUS | Status: DC | PRN
  Administered 2023-01-07: 100 mg via INTRAVENOUS
  Administered 2023-01-07: 150 ug/kg/min via INTRAVENOUS

## 2023-01-07 MED ORDER — SODIUM CHLORIDE 0.9 % IV SOLN
INTRAVENOUS | Status: DC | PRN
Start: 2023-01-07 — End: 2023-01-07

## 2023-01-07 MED ORDER — LIDOCAINE HCL (CARDIAC) PF 100 MG/5ML IV SOSY
PREFILLED_SYRINGE | INTRAVENOUS | Status: DC | PRN
  Administered 2023-01-07: 50 mg via INTRAVENOUS

## 2023-01-07 NOTE — Anesthesia Preprocedure Evaluation (Signed)
Anesthesia Evaluation  Patient identified by MRN, date of birth, ID band Patient awake    Reviewed: Allergy & Precautions, NPO status , Patient's Chart, lab work & pertinent test results  History of Anesthesia Complications Negative for: history of anesthetic complications  Airway Mallampati: I  TM Distance: >3 FB Neck ROM: full    Dental no notable dental hx.    Pulmonary neg pulmonary ROS   Pulmonary exam normal        Cardiovascular negative cardio ROS Normal cardiovascular exam     Neuro/Psych negative neurological ROS  negative psych ROS   GI/Hepatic negative GI ROS, Neg liver ROS,,,  Endo/Other  negative endocrine ROS    Renal/GU negative Renal ROS  negative genitourinary   Musculoskeletal   Abdominal   Peds  Hematology negative hematology ROS (+)   Anesthesia Other Findings Past Medical History: No date: Allergic rhinitis  Past Surgical History: 2018: SHOULDER SURGERY; Right     Reproductive/Obstetrics negative OB ROS                             Anesthesia Physical Anesthesia Plan  ASA: 1  Anesthesia Plan: General   Post-op Pain Management: Minimal or no pain anticipated   Induction: Intravenous  PONV Risk Score and Plan: 1 and Propofol infusion and TIVA  Airway Management Planned: Natural Airway and Nasal Cannula  Additional Equipment:   Intra-op Plan:   Post-operative Plan:   Informed Consent: I have reviewed the patients History and Physical, chart, labs and discussed the procedure including the risks, benefits and alternatives for the proposed anesthesia with the patient or authorized representative who has indicated his/her understanding and acceptance.     Dental Advisory Given  Plan Discussed with: Anesthesiologist, CRNA and Surgeon  Anesthesia Plan Comments: (Patient consented for risks of anesthesia including but not limited to:  - adverse  reactions to medications - risk of airway placement if required - damage to eyes, teeth, lips or other oral mucosa - nerve damage due to positioning  - sore throat or hoarseness - Damage to heart, brain, nerves, lungs, other parts of body or loss of life  Patient voiced understanding and assent.)       Anesthesia Quick Evaluation

## 2023-01-07 NOTE — Transfer of Care (Signed)
Immediate Anesthesia Transfer of Care Note  Patient: Shloima Caswell Lybarger  Procedure(s) Performed: COLONOSCOPY WITH PROPOFOL  Patient Location: PACU  Anesthesia Type:General  Level of Consciousness: awake and patient cooperative  Airway & Oxygen Therapy: Patient Spontanous Breathing  Post-op Assessment: Report given to RN and Patient moving all extremities X 4  Post vital signs: Reviewed and stable  Last Vitals:  Vitals Value Taken Time  BP 112/79 01/07/23 0955  Temp 36.4 C 01/07/23 0954  Pulse 93 01/07/23 0955  Resp 18 01/07/23 0955  SpO2 100 % 01/07/23 0955  Vitals shown include unfiled device data.  Last Pain:  Vitals:   01/07/23 0955  TempSrc:   PainSc: 0-No pain         Complications: No notable events documented.

## 2023-01-07 NOTE — Op Note (Signed)
[facility] Gastroenterology Patient Name: Cory [name] Procedure Date: 01/07/2023 9:17 AM MRN: [id number] Account #: [id number] Date of Birth: 01-17-1981 Admit Type: Outpatient Age: 42 Room: [facility] ENDO ROOM 2 Gender: Male Note Status: Finalized Instrument Name: Prentice Docker [id number] Procedure:             Colonoscopy Indications:           Rectal bleeding Providers:             Wyline Mood MD, MD Referring MD:          No Local Md, MD (Referring MD) Medicines:             Monitored Anesthesia Care Complications:         No immediate complications. Procedure:             Pre-Anesthesia Assessment:                        - Prior to the procedure, a History and Physical was                         performed, and patient medications, allergies and                         sensitivities were reviewed. The patient's tolerance                         of previous anesthesia was reviewed.                        - The risks and benefits of the procedure and the                         sedation options and risks were discussed with the                         patient. All questions were answered and informed                         consent was obtained.                        - After reviewing the risks and benefits, the patient                         was deemed in satisfactory condition to undergo the                         procedure.                        - ASA Grade Assessment: II - A patient with mild                         systemic disease.                        After obtaining informed consent, the colonoscope was                         passed under direct vision. Throughout the procedure,  the patient's blood pressure, pulse, and oxygen                         saturations were monitored continuously. The                         Colonoscope was introduced through the anus and                         advanced to the the cecum, identified by the                          appendiceal orifice. The colonoscopy was performed                         with ease. The patient tolerated the procedure well.                         The quality of the bowel preparation was excellent.                         The ileocecal valve, appendiceal orifice, and rectum                         were photographed. Findings:      Non-bleeding internal hemorrhoids were found during retroflexion. The       hemorrhoids were medium-sized and Grade I (internal hemorrhoids that do       not prolapse).      The exam was otherwise without abnormality on direct and retroflexion       views. Impression:            - Non-bleeding internal hemorrhoids.                        - The examination was otherwise normal on direct and                         retroflexion views.                        - No specimens collected. Recommendation:        - Discharge patient to home (with escort).                        - Resume previous diet.                        - Continue present medications.                        - Repeat colonoscopy in 10 years for screening                         purposes. Procedure Code(s):     --- Professional ---                        [phone], Colonoscopy, flexible; diagnostic, including                         collection of  specimen(s) by brushing or washing, when                         performed (separate procedure) Diagnosis Code(s):     --- Professional ---                        K64.0, First degree hemorrhoids                        K62.5, Hemorrhage of anus and rectum CPT copyright 2022 American Medical Association. All rights reserved. The codes documented in this report are preliminary and upon coder review may  be revised to meet current compliance requirements. Wyline Mood, MD Wyline Mood MD, MD 01/07/2023 9:51:51 AM This report has been signed electronically. Number of Addenda: 0 Note Initiated On: 01/07/2023 9:17 AM Scope Withdrawal  Time: 0 hours 8 minutes 28 seconds  Total Procedure Duration: 0 hours 9 minutes 50 seconds  Estimated Blood Loss:  Estimated blood loss: none.      [facility]

## 2023-01-07 NOTE — Anesthesia Postprocedure Evaluation (Signed)
Anesthesia Post Note  Patient: Cory [name]  Procedure(s) Performed: COLONOSCOPY WITH PROPOFOL  Patient location during evaluation: Endoscopy Anesthesia Type: General Level of consciousness: awake and alert Pain management: pain level controlled Vital Signs Assessment: post-procedure vital signs reviewed and stable Respiratory status: spontaneous breathing, nonlabored ventilation, respiratory function stable and patient connected to nasal cannula oxygen Cardiovascular status: blood pressure returned to baseline and stable Postop Assessment: no apparent nausea or vomiting Anesthetic complications: no   No notable events documented.   Last Vitals:  Vitals:   01/07/23 0955 01/07/23 1014  BP: 112/79 131/80  Pulse:    Resp: 16   Temp:    SpO2: 97%     Last Pain:  Vitals:   01/07/23 1014  TempSrc:   PainSc: 0-No pain                 [name]

## 2023-01-07 NOTE — H&P (Signed)
     Wyline Mood, MD [address], [place], [place], [address] [address], [address], [place], [place], [address] Phone: [phone]  Fax: [phone]  Primary Care Physician:  Patient, No Pcp Per   Pre-Procedure History & Physical: HPI:  Cory [name] is a 42 y.o. male is here for an colonoscopy.   Past Medical History:  Diagnosis Date   Allergic rhinitis     Past Surgical History:  Procedure Laterality Date   SHOULDER SURGERY Right 2018    Prior to Admission medications   Medication Sig Start Date End Date Taking? Authorizing Provider  hydrocortisone (ANUSOL-HC) 2.5 % rectal cream Place 1 Application rectally 2 (two) times daily. 12/02/22   Celso Amy, PA-C  testosterone cypionate (DEPOTESTOSTERONE CYPIONATE) 200 MG/ML injection Inject 200 mg into the muscle every 14 (fourteen) days.    [provider]    Allergies as of 12/02/2022 - Review Complete 12/02/2022  Allergen Reaction Noted   Elemental sulfur Hives 11/22/2018   Sulfa drugs cross reactors Hives 11/30/2022    History reviewed. No pertinent family history.  Social History   Socioeconomic History   Marital status: Married    Spouse name: Not on file   Number of children: 3   Years of education: Not on file   Highest education level: Not on file  Occupational History   Not on file  Tobacco Use   Smoking status: Never   Smokeless tobacco: Never  Vaping Use   Vaping status: Never Used  Substance and Sexual Activity   Alcohol use: Yes   Drug use: No   Sexual activity: Not on file  Other Topics Concern   Not on file  Social History Narrative   Not on file   Social Determinants of Health   Financial Resource Strain: Not on file  Food Insecurity: Not on file  Transportation Needs: Not on file  Physical Activity: Not on file  Stress: Not on file  Social Connections: Not on file  Intimate Partner Violence: Not on file    Review of Systems: See HPI, otherwise negative  ROS  Physical Exam: BP 135/89   Pulse 70   Temp (!) 97 F (36.1 C) (Temporal)   Resp 18   Wt 98.2 kg   SpO2 100%   BMI 27.81 kg/m  General:   Alert,  pleasant and cooperative in NAD Head:  Normocephalic and atraumatic. Neck:  Supple; no masses or thyromegaly. Lungs:  Clear throughout to auscultation, normal respiratory effort.    Heart:  +S1, +S2, Regular rate and rhythm, No edema. Abdomen:  Soft, nontender and nondistended. Normal bowel sounds, without guarding, and without rebound.   Neurologic:  Alert and  oriented x4;  grossly normal neurologically.  Impression/Plan: Cory [name] is here for an colonoscopy to be performed for rectal bleeding. Risks, benefits, limitations, and alternatives regarding  colonoscopy have been reviewed with the patient.  Questions have been answered.  All parties agreeable.   Wyline Mood, MD  01/07/2023, 8:56 AM

## 2023-01-07 NOTE — Anesthesia Procedure Notes (Signed)
Procedure Name: MAC Date/Time: 01/07/2023 9:41 AM  Performed by: Lily Lovings, CRNAPre-anesthesia Checklist: Patient identified, Emergency Drugs available, Suction available and Patient being monitored Patient Re-evaluated:Patient Re-evaluated prior to induction Oxygen Delivery Method: Simple face mask Preoxygenation: Pre-oxygenation with 100% oxygen Induction Type: IV induction Comments: POM

## 2023-01-08 ENCOUNTER — Encounter: Payer: Self-pay | Admitting: Gastroenterology

## 2023-02-25 ENCOUNTER — Other Ambulatory Visit: Payer: Self-pay

## 2023-02-28 NOTE — Progress Notes (Signed)
 [name], PA-C [address]  [address]  [place], [place] [address]  Main: [phone]  Fax: [phone]   Primary Care Physician: Patient, No Pcp Per  Primary Gastroenterologist:  [name], PA-C / Dr. Ruel Kung    CC: Follow-up rectal bleeding and hemorrhoids; evaluate acid reflux  HPI: Cory [name] is a 43 y.o. male returns for follow-up.  I last saw patient 12/02/2022 to evaluate hemorrhoid and rectal bleeding.  Treated with hydrocortisone  2.5% cream and conservative treatment.  Colonoscopy 01/07/2023 by Dr. Kung showed grade 1 nonbleeding internal hemorrhoids, excellent prep, otherwise normal with no polyps.  10-year repeat.  Current symptoms: He continues to have mild occasional intermittent bright red blood in the toilet after bowel movement attributed to internal hemorrhoid bleeding.  No rectal pain.  Hydrocortisone  cream did not help.  He denies constipation or diarrhea.  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  New symptom: He reports moderate acid reflux with increased belching and abdominal bloating for several months.  He denies dysphagia, nausea, or vomiting.  Has mild dyspepsia and epigastric discomfort.  Tried OTC Prilosec with no benefit.  Recently took Nexium  40 mg daily for the past week which helped.  Needs prescription for Nexium .  He reports having EGD 15 years ago which reportedly showed hiatal hernia.  Results unavailable.  No recent H. pylori test.  Current Outpatient Medications  Medication Sig Dispense Refill   anastrozole (ARIMIDEX) 1 MG tablet Take by mouth.     doxycycline (VIBRA-TABS) 100 MG tablet Take 100 mg by mouth 2 (two) times daily.     esomeprazole  (NEXIUM ) 40 MG capsule Take 1 capsule (40 mg total) by mouth daily at 12 noon. 90 capsule 3   hydrocortisone  (ANUSOL -HC) 2.5 % rectal cream Place 1 Application rectally 2 (two) times daily. 30 g 1   hydrocortisone  (ANUSOL -HC) 25 MG suppository Place 1 suppository (25 mg  total) rectally at bedtime. 12 suppository 1   lisdexamfetamine (VYVANSE) 60 MG capsule Take 60 mg by mouth every morning.     testosterone cypionate (DEPOTESTOSTERONE CYPIONATE) 200 MG/ML injection Inject 200 mg into the muscle every 14 (fourteen) days.     No current facility-administered medications for this visit.    Allergies as of 03/01/2023 - Review Complete 03/01/2023  Allergen Reaction Noted   Elemental sulfur Hives 11/22/2018   Sulfa drugs cross reactors Hives 11/30/2022    Past Medical History:  Diagnosis Date   Allergic rhinitis     Past Surgical History:  Procedure Laterality Date   COLONOSCOPY WITH PROPOFOL  N/A 01/07/2023   Procedure: COLONOSCOPY WITH PROPOFOL ;  Surgeon: Kung Ruel, MD;  Location: [facility] ENDOSCOPY;  Service: Gastroenterology;  Laterality: N/A;   SHOULDER SURGERY Right 2018    Review of Systems:    All systems reviewed and negative except where noted in HPI.   Physical Examination:   BP 124/72   Pulse 70   Temp 98.1 F (36.7 C)   Ht 6' 2 (1.88 m)   Wt 220 lb (99.8 kg)   BMI 28.25 kg/m   General: Well-nourished, well-developed in no acute distress.  Neuro: Alert and oriented x 3.  Grossly intact.  Psych: Alert and cooperative, normal mood and affect.   Imaging Studies: No results found.  Assessment and Plan:   Cory [name] is a 43 y.o. y/o male returns for follow-up of rectal bleeding and hemorrhoids.  Recent colonoscopy showed internal hemorrhoids, otherwise normal.  No polyps.  No  evidence of IBD.  1.  Internal hemorrhoids Rx Hydrocortisone  Suppositories 25mg  Insert 1 into rectum once daily at bedtime for 10-14 days. Stressed importance of treating underlying constipation. Avoid Sitting on the toilet for prolonged amount of time. Discussed Internal Hemorrhoid Banding if no improvement with conservative treament.   2.  GERD  Recommend Lifestyle Modifications to prevent Acid Reflux.  Rec. Avoid coffee, sodas, peppermint, garlic,  onions, alcohol, citrus fruits, chocolate, tomatoes, fatty and spicey foods.  Avoid eating 2-3 hours before bedtime.    Rx Nexium  40mg  1 tablet daily.  3.  Epigastric Discomfort / Dyspepsia  Check H. Pylori Breat Test.  Start Nexium  after completing H. pylori test.  EGD is next step if symptoms persist.  4.  Colon cancer screening Reassurance regarding recent normal colonoscopy.  10-year repeat.  [name], PA-C  Follow up As needed if symptoms worsen or persist.

## 2023-03-01 ENCOUNTER — Ambulatory Visit (INDEPENDENT_AMBULATORY_CARE_PROVIDER_SITE_OTHER): Payer: No Typology Code available for payment source | Admitting: Physician Assistant

## 2023-03-01 ENCOUNTER — Encounter: Payer: Self-pay | Admitting: Physician Assistant

## 2023-03-01 VITALS — BP 124/72 | HR 70 | Temp 98.1°F | Ht 74.0 in | Wt 220.0 lb

## 2023-03-01 DIAGNOSIS — K219 Gastro-esophageal reflux disease without esophagitis: Secondary | ICD-10-CM | POA: Diagnosis not present

## 2023-03-01 DIAGNOSIS — R1013 Epigastric pain: Secondary | ICD-10-CM

## 2023-03-01 DIAGNOSIS — K625 Hemorrhage of anus and rectum: Secondary | ICD-10-CM | POA: Diagnosis not present

## 2023-03-01 DIAGNOSIS — K648 Other hemorrhoids: Secondary | ICD-10-CM | POA: Diagnosis not present

## 2023-03-01 MED ORDER — HYDROCORTISONE ACETATE 25 MG RE SUPP: 25.0000 mg | Freq: Every day | RECTAL | 1 refills | Status: AC

## 2023-03-01 MED ORDER — ESOMEPRAZOLE MAGNESIUM 40 MG PO CPDR: 40.0000 mg | DELAYED_RELEASE_CAPSULE | Freq: Every day | ORAL | 3 refills | Status: AC

## 2023-03-01 NOTE — Patient Instructions (Signed)
 Please call and let me know if your symptoms persist in spite of treatment.  If you continue to have Acid Reflux, and Nexium  does not work, then we can schedule an Upper Endoscopy.  Wait until after completing H. Pylori Breath Test to Start on Nexium .  If you continue to have Hemorrhoid symptoms, and Hydrocortisone  Suppositories doe not help, then we can schedule Hemorrhoid Banding with Dr. Therisa.

## 2023-03-04 LAB — H. PYLORI BREATH TEST: H pylori Breath Test: NEGATIVE

## 2024-03-17 ENCOUNTER — Other Ambulatory Visit: Payer: Self-pay | Admitting: Physician Assistant

## 2024-03-17 DIAGNOSIS — K219 Gastro-esophageal reflux disease without esophagitis: Secondary | ICD-10-CM
